# Patient Record
Sex: Female | Born: 1935 | Race: Black or African American | Hispanic: No | Smoking: Never smoker
Health system: Southern US, Community
[De-identification: ages and names within clinical notes are randomized; demographics above are authoritative.]

## PROBLEM LIST (undated history)

## (undated) DIAGNOSIS — I10 Essential (primary) hypertension: Secondary | ICD-10-CM

## (undated) DIAGNOSIS — F0390 Unspecified dementia without behavioral disturbance: Secondary | ICD-10-CM

## (undated) DIAGNOSIS — C50919 Malignant neoplasm of unspecified site of unspecified female breast: Secondary | ICD-10-CM

## (undated) DIAGNOSIS — F419 Anxiety disorder, unspecified: Secondary | ICD-10-CM

## (undated) DIAGNOSIS — F32A Depression, unspecified: Secondary | ICD-10-CM

## (undated) DIAGNOSIS — M1990 Unspecified osteoarthritis, unspecified site: Secondary | ICD-10-CM

## (undated) DIAGNOSIS — C801 Malignant (primary) neoplasm, unspecified: Secondary | ICD-10-CM

## (undated) HISTORY — PX: ABDOMINAL HYSTERECTOMY: SHX81

## (undated) HISTORY — DX: Unspecified dementia, unspecified severity, without behavioral disturbance, psychotic disturbance, mood disturbance, and anxiety: F03.90

## (undated) HISTORY — PX: MASTECTOMY: SHX3

## (undated) HISTORY — DX: Anxiety disorder, unspecified: F41.9

## (undated) HISTORY — DX: Depression, unspecified: F32.A

---

## 2004-06-10 ENCOUNTER — Ambulatory Visit: Payer: Self-pay | Admitting: Internal Medicine

## 2005-08-11 ENCOUNTER — Ambulatory Visit: Payer: Self-pay | Admitting: Internal Medicine

## 2006-08-14 ENCOUNTER — Ambulatory Visit: Payer: Self-pay | Admitting: Internal Medicine

## 2007-05-13 ENCOUNTER — Other Ambulatory Visit: Payer: Self-pay

## 2007-05-13 ENCOUNTER — Inpatient Hospital Stay: Payer: Self-pay | Admitting: Vascular Surgery

## 2007-08-16 ENCOUNTER — Ambulatory Visit: Payer: Self-pay | Admitting: Internal Medicine

## 2008-08-18 ENCOUNTER — Ambulatory Visit: Payer: Self-pay | Admitting: Internal Medicine

## 2009-07-25 ENCOUNTER — Ambulatory Visit: Payer: Self-pay | Admitting: Ophthalmology

## 2009-08-08 ENCOUNTER — Ambulatory Visit: Payer: Self-pay | Admitting: Ophthalmology

## 2009-08-28 ENCOUNTER — Ambulatory Visit: Payer: Self-pay | Admitting: Internal Medicine

## 2010-11-05 ENCOUNTER — Ambulatory Visit: Payer: Self-pay | Admitting: Internal Medicine

## 2011-11-20 ENCOUNTER — Ambulatory Visit: Payer: Self-pay | Admitting: Internal Medicine

## 2012-04-17 ENCOUNTER — Emergency Department: Payer: Self-pay | Admitting: Emergency Medicine

## 2012-04-17 LAB — COMPREHENSIVE METABOLIC PANEL
Anion Gap: 5 — ABNORMAL LOW (ref 7–16)
Calcium, Total: 9.5 mg/dL (ref 8.5–10.1)
Chloride: 106 mmol/L (ref 98–107)
Co2: 29 mmol/L (ref 21–32)
EGFR (African American): >60
EGFR (Non-African Amer.): 58 — ABNORMAL LOW
Potassium: 3.6 mmol/L (ref 3.5–5.1)
SGOT(AST): 21 U/L (ref 15–37)
SGPT (ALT): 22 U/L (ref 12–78)
Total Protein: 8.2 g/dL (ref 6.4–8.2)

## 2012-04-17 LAB — CBC
HCT: 37.8 % (ref 35.0–47.0)
MCV: 89 fL (ref 80–100)
Platelet: 336 10*3/uL (ref 150–440)
RBC: 4.26 10*6/uL (ref 3.80–5.20)
WBC: 8.2 10*3/uL (ref 3.6–11.0)

## 2012-04-17 LAB — CK TOTAL AND CKMB (NOT AT ARMC)
CK, Total: 154 U/L (ref 21–215)
CK-MB: 1.5 ng/mL (ref 0.5–3.6)

## 2012-07-12 ENCOUNTER — Ambulatory Visit: Payer: Self-pay | Admitting: Internal Medicine

## 2012-11-24 ENCOUNTER — Ambulatory Visit: Payer: Self-pay | Admitting: Ophthalmology

## 2013-02-08 ENCOUNTER — Ambulatory Visit: Payer: Self-pay | Admitting: Internal Medicine

## 2013-10-31 DIAGNOSIS — I10 Essential (primary) hypertension: Secondary | ICD-10-CM | POA: Insufficient documentation

## 2013-10-31 DIAGNOSIS — M1990 Unspecified osteoarthritis, unspecified site: Secondary | ICD-10-CM | POA: Insufficient documentation

## 2013-10-31 DIAGNOSIS — J302 Other seasonal allergic rhinitis: Secondary | ICD-10-CM | POA: Insufficient documentation

## 2013-10-31 DIAGNOSIS — E785 Hyperlipidemia, unspecified: Secondary | ICD-10-CM | POA: Insufficient documentation

## 2014-03-17 ENCOUNTER — Ambulatory Visit: Payer: Self-pay | Admitting: Internal Medicine

## 2014-05-19 DIAGNOSIS — M5416 Radiculopathy, lumbar region: Secondary | ICD-10-CM | POA: Insufficient documentation

## 2014-05-19 DIAGNOSIS — M5126 Other intervertebral disc displacement, lumbar region: Secondary | ICD-10-CM | POA: Insufficient documentation

## 2014-08-22 ENCOUNTER — Ambulatory Visit: Payer: Self-pay | Admitting: Gastroenterology

## 2014-09-25 LAB — SURGICAL PATHOLOGY

## 2015-03-13 DIAGNOSIS — M1711 Unilateral primary osteoarthritis, right knee: Secondary | ICD-10-CM | POA: Insufficient documentation

## 2015-11-21 DIAGNOSIS — E042 Nontoxic multinodular goiter: Secondary | ICD-10-CM | POA: Insufficient documentation

## 2016-12-04 ENCOUNTER — Other Ambulatory Visit: Payer: Self-pay

## 2016-12-04 ENCOUNTER — Emergency Department: Payer: Medicare Other

## 2016-12-04 ENCOUNTER — Emergency Department
Admission: EM | Admit: 2016-12-04 | Discharge: 2016-12-04 | Disposition: A | Discharge status: Home / Self Care | Payer: Medicare Other | Attending: Emergency Medicine | Admitting: Emergency Medicine

## 2016-12-04 ENCOUNTER — Encounter: Payer: Self-pay | Admitting: *Deleted

## 2016-12-04 DIAGNOSIS — R000 Tachycardia, unspecified: Secondary | ICD-10-CM | POA: Insufficient documentation

## 2016-12-04 DIAGNOSIS — R5381 Other malaise: Secondary | ICD-10-CM | POA: Diagnosis present

## 2016-12-04 DIAGNOSIS — R531 Weakness: Secondary | ICD-10-CM | POA: Diagnosis not present

## 2016-12-04 LAB — COMPREHENSIVE METABOLIC PANEL
ALK PHOS: 40 U/L (ref 38–126)
ALT: 14 U/L (ref 14–54)
ANION GAP: 11 (ref 5–15)
AST: 23 U/L (ref 15–41)
Albumin: 4.6 g/dL (ref 3.5–5.0)
BUN: 19 mg/dL (ref 6–20)
CALCIUM: 9.9 mg/dL (ref 8.9–10.3)
CO2: 26 mmol/L (ref 22–32)
Chloride: 103 mmol/L (ref 101–111)
Creatinine, Ser: 1.21 mg/dL — ABNORMAL HIGH (ref 0.44–1.00)
GFR calc non Af Amer: 41 mL/min — ABNORMAL LOW (ref >60)
GFR, EST AFRICAN AMERICAN: 48 mL/min — AB (ref >60)
Glucose, Bld: 92 mg/dL (ref 65–99)
Potassium: 4.1 mmol/L (ref 3.5–5.1)
SODIUM: 140 mmol/L (ref 135–145)
Total Bilirubin: 1.5 mg/dL — ABNORMAL HIGH (ref 0.3–1.2)
Total Protein: 7.7 g/dL (ref 6.5–8.1)

## 2016-12-04 LAB — URINALYSIS, COMPLETE (UACMP) WITH MICROSCOPIC
BILIRUBIN URINE: NEGATIVE
Bacteria, UA: NONE SEEN
GLUCOSE, UA: NEGATIVE mg/dL
HGB URINE DIPSTICK: NEGATIVE
KETONES UR: NEGATIVE mg/dL
Leukocytes, UA: NEGATIVE
NITRITE: NEGATIVE
PROTEIN: NEGATIVE mg/dL
Specific Gravity, Urine: 1.015 (ref 1.005–1.030)
WBC UA: NONE SEEN WBC/hpf (ref 0–5)
pH: 5 (ref 5.0–8.0)

## 2016-12-04 LAB — T4, FREE: Free T4: 0.8 ng/dL (ref 0.61–1.12)

## 2016-12-04 LAB — CBC
HCT: 37.6 % (ref 35.0–47.0)
HEMOGLOBIN: 12.8 g/dL (ref 12.0–16.0)
MCH: 30.6 pg (ref 26.0–34.0)
MCHC: 34.1 g/dL (ref 32.0–36.0)
MCV: 89.7 fL (ref 80.0–100.0)
Platelets: 283 10*3/uL (ref 150–440)
RBC: 4.19 MIL/uL (ref 3.80–5.20)
RDW: 13.8 % (ref 11.5–14.5)
WBC: 3.6 10*3/uL (ref 3.6–11.0)

## 2016-12-04 LAB — LIPASE, BLOOD: Lipase: 38 U/L (ref 11–51)

## 2016-12-04 LAB — TROPONIN I: Troponin I: <0.03 ng/mL (ref <0.03)

## 2016-12-04 LAB — TSH: TSH: 2.78 u[IU]/mL (ref 0.350–4.500)

## 2016-12-04 MED ORDER — SODIUM CHLORIDE 0.9 % IV SOLN
Freq: Once | INTRAVENOUS | Status: AC
  Administered 2016-12-04: 16:00:00 via INTRAVENOUS

## 2016-12-04 MED ORDER — LORAZEPAM 0.5 MG PO TABS: 0.5000 mg | ORAL_TABLET | Freq: Three times a day (TID) | ORAL | 0 refills | Status: AC | PRN

## 2016-12-04 NOTE — ED Triage Notes (Signed)
PT to ED reporting that she can not stop shaking and that she "donesn't feel well." Pt is shaking in triage but will stop when you hold her arms. PT is warm to the touch and denies feeling cold at this time. Pt denies pain and other than nausea denies symptoms other than general malaise. No slurred speech, weakness or droop. Pt is disoriented to time and forgets to answer multiple questions in triage. RN will ask a question and mid response pt stops answering and stares forward. When asked again the pt will answer the question. Oriented to self and situation. Pt denies SOB but is tachypnic. No wheezing noted at this time.

## 2016-12-04 NOTE — ED Provider Notes (Signed)
[facility] Emergency Department Provider Note       Time seen: ----------------------------------------- 3:52 PM on 12/04/2016 -----------------------------------------     I have reviewed the triage vital signs and the nursing notes.   HISTORY   Chief Complaint Altered Mental Status and Nausea    HPI Megan Ross is a 83 y.o. female who presents to the ED for intermittent shaking feeling in stating she does not feel well. Patient reports she feels cold all the time and has general malaise. She denies fevers, chills, chest pain, shortness of breath, vomiting or other complaints. Family is concerned she may not be drinking enough fluid and sometimes she gets anxious. She denies fevers, chills or other complaints at this time.   History reviewed. No pertinent past medical history.  There are no active problems to display for this patient.   History reviewed. No pertinent surgical history.  Allergies Patient has no allergy information on record.  Social History Social History  Substance Use Topics  . Smoking status: Never Smoker  . Smokeless tobacco: Never Used  . Alcohol use No    Review of Systems Constitutional: Negative for fever. Eyes: Negative for vision changes ENT:  Negative for congestion, sore throat Cardiovascular: Negative for chest pain. Respiratory: Negative for shortness of breath. Gastrointestinal: Negative for abdominal pain, vomiting and diarrhea. Genitourinary: Negative for dysuria. Musculoskeletal: Negative for back pain. Skin: Negative for rash. Neurological:Positive for weakness  All systems negative/normal/unremarkable except as stated in the HPI  ____________________________________________   PHYSICAL EXAM:  VITAL SIGNS: ED Triage Vitals  Enc Vitals Group     BP 12/04/16 1400 139/79     Pulse Rate 12/04/16 1400 (!) 110     Resp 12/04/16 1400 (!) 22     Temp 12/04/16 1400 99.3 F (37.4 C)     Temp  Source 12/04/16 1400 Oral     SpO2 12/04/16 1400 100 %     Weight 12/04/16 1401 180 lb (81.6 kg)     Height --      Head Circumference --      Peak Flow --      Pain Score --      Pain Loc --      Pain Edu? --      Excl. in [place]? --     Constitutional: AlertBut disoriented, Well appearing and in no distress. Eyes: Conjunctivae are normal. Normal extraocular movements. ENT   Head: Normocephalic and atraumatic.   Nose: No congestion/rhinnorhea.   Mouth/Throat: Mucous membranes are moist.   Neck: No stridor. Cardiovascular:Rapid rate, regular rhythm. No murmurs, rubs, or gallops. Respiratory: Normal respiratory effort without tachypnea nor retractions. Breath sounds are clear and equal bilaterally. No wheezes/rales/rhonchi. Gastrointestinal: Soft and nontender. Normal bowel sounds Musculoskeletal: Nontender with normal range of motion in extremities. No lower extremity tenderness nor edema. Neurologic:  Normal speech and language. No gross focal neurologic deficits are appreciated.  Skin:  Skin is warm, dry and intact. No rash noted. Psychiatric: Mood and affect are normal. Speech and behavior are normal.  ____________________________________________  EKG: Interpreted by me.Sinus tachycardia with a rate of 105 bpm, normal QRS size, normal QT, nonspecific T-wave abnormalities  ____________________________________________  ED COURSE:  Pertinent labs & imaging results that were available during my care of the patient were reviewed by me and considered in my medical decision making (see chart for details). Patient presents for weakness, we will assess with labs and imaging as indicated.   Procedures ____________________________________________  LABS (pertinent positives/negatives)  Labs Reviewed  COMPREHENSIVE METABOLIC PANEL - Abnormal; Notable for the following:       Result Value   Creatinine, Ser 1.21 (*)    Total Bilirubin 1.5 (*)    GFR calc non Af Amer 41 (*)     GFR calc Af Amer 48 (*)    All other components within normal limits  URINALYSIS, COMPLETE (UACMP) WITH MICROSCOPIC - Abnormal; Notable for the following:    Color, Urine YELLOW (*)    APPearance CLEAR (*)    Squamous Epithelial / LPF 0-5 (*)    All other components within normal limits  LIPASE, BLOOD  CBC  TSH  T4, FREE  TROPONIN I    RADIOLOGY Images were viewed by me  CT head, chest x-ray IMPRESSION: There is no pneumonia, CHF, nor other acute cardiopulmonary abnormality. 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  IMPRESSION: Stable an normal for age noncontrast CT appearance of the brain. ____________________________________________  FINAL ASSESSMENT AND PLAN  Weakness, Tachycardia  Plan: Patient's labs and imaging were dictated above. Patient had presented for general ill feeling and weakness. She may have some underlying anxiety but overall are only findings today are mild tachycardia. She does not have other objective signs for infection. I will discharge with close outpatient follow-up.   Earleen Newport, MD   Note: This note was generated in part or whole with voice recognition software. Voice recognition is usually quite accurate but there are transcription errors that can and very often do occur. I apologize for any typographical errors that were not detected and corrected.     Earleen Newport, MD 12/04/16 [phone]

## 2018-06-22 ENCOUNTER — Emergency Department: Payer: Medicare Other

## 2018-06-22 ENCOUNTER — Emergency Department
Admission: EM | Admit: 2018-06-22 | Discharge: 2018-06-22 | Disposition: A | Discharge status: Home / Self Care | Payer: Medicare Other | Attending: Emergency Medicine | Admitting: Emergency Medicine

## 2018-06-22 ENCOUNTER — Encounter: Payer: Self-pay | Admitting: Emergency Medicine

## 2018-06-22 ENCOUNTER — Other Ambulatory Visit: Payer: Self-pay

## 2018-06-22 DIAGNOSIS — Z853 Personal history of malignant neoplasm of breast: Secondary | ICD-10-CM | POA: Insufficient documentation

## 2018-06-22 DIAGNOSIS — R079 Chest pain, unspecified: Secondary | ICD-10-CM | POA: Insufficient documentation

## 2018-06-22 DIAGNOSIS — R0602 Shortness of breath: Secondary | ICD-10-CM | POA: Diagnosis not present

## 2018-06-22 DIAGNOSIS — I10 Essential (primary) hypertension: Secondary | ICD-10-CM | POA: Insufficient documentation

## 2018-06-22 HISTORY — DX: Malignant neoplasm of unspecified site of unspecified female breast: C50.919

## 2018-06-22 HISTORY — DX: Unspecified osteoarthritis, unspecified site: M19.90

## 2018-06-22 HISTORY — DX: Essential (primary) hypertension: I10

## 2018-06-22 HISTORY — DX: Malignant (primary) neoplasm, unspecified: C80.1

## 2018-06-22 LAB — CBC
HEMATOCRIT: 36.7 % (ref 36.0–46.0)
Hemoglobin: 11.7 g/dL — ABNORMAL LOW (ref 12.0–15.0)
MCH: 28.4 pg (ref 26.0–34.0)
MCHC: 31.9 g/dL (ref 30.0–36.0)
MCV: 89.1 fL (ref 80.0–100.0)
NRBC: 0 % (ref 0.0–0.2)
PLATELETS: 324 10*3/uL (ref 150–400)
RBC: 4.12 MIL/uL (ref 3.87–5.11)
RDW: 14.1 % (ref 11.5–15.5)
WBC: 9.2 10*3/uL (ref 4.0–10.5)

## 2018-06-22 LAB — TROPONIN I
Troponin I: <0.03 ng/mL (ref <0.03)
Troponin I: <0.03 ng/mL (ref <0.03)

## 2018-06-22 LAB — BASIC METABOLIC PANEL
Anion gap: 5 (ref 5–15)
BUN: 12 mg/dL (ref 8–23)
CHLORIDE: 107 mmol/L (ref 98–111)
CO2: 26 mmol/L (ref 22–32)
CREATININE: 0.85 mg/dL (ref 0.44–1.00)
Calcium: 9.4 mg/dL (ref 8.9–10.3)
Glucose, Bld: 152 mg/dL — ABNORMAL HIGH (ref 70–99)
POTASSIUM: 3.4 mmol/L — AB (ref 3.5–5.1)
SODIUM: 138 mmol/L (ref 135–145)

## 2018-06-22 MED ORDER — SODIUM CHLORIDE 0.9% FLUSH: 3.0000 mL | Freq: Once | INTRAVENOUS | Status: DC

## 2018-06-22 MED ORDER — ASPIRIN 81 MG PO CHEW
324.0000 mg | CHEWABLE_TABLET | Freq: Once | ORAL | Status: AC
  Administered 2018-06-22: 324 mg via ORAL
  Filled 2018-06-22: qty 4

## 2018-06-22 MED ORDER — IOHEXOL 350 MG/ML SOLN
75.0000 mL | Freq: Once | INTRAVENOUS | Status: AC | PRN
  Administered 2018-06-22: 75 mL via INTRAVENOUS

## 2018-06-22 NOTE — ED Notes (Signed)
Pt ambulatory to toilet at this time. Will obtain blood specimen when pt is finished using the bathroom.

## 2018-06-22 NOTE — Discharge Instructions (Addendum)
These make an appointment to see Dr. end, the cardiologist on-call, about your chest pain.  He may recommend additional testing, including possible stress test.  Return to the emergency department if you develop severe pain, lightheadedness or fainting, shortness of breath, fever, or any other symptoms concerning to you.

## 2018-06-22 NOTE — ED Notes (Signed)
Pt ambulatory to toilet

## 2018-06-22 NOTE — ED Provider Notes (Signed)
[facility] Emergency Department Provider Note  ____________________________________________  Time seen: Approximately 12:16 PM  I have reviewed the triage vital signs and the nursing notes.   HISTORY  Chief Complaint Chest Pain and Shortness of Breath    HPI Megan Ross is a 85 y.o. female w/ a hx of HTN, breast ca, presenting w/ CP and SOB.  Patient reports that she woke up at 4 AM with a severe central chest pressure associated with shortness of breath.  This lasted for several hours.  She denies any radiation, palpitations, lightheadedness or syncope, diaphoresis, nausea or vomiting.  She has not been experiencing chest pain with exertion over the last several weeks.  At this time, the patient is symptom-free.  She reports a stress test several years ago which was reportedly negative.  Past Medical History:  Diagnosis Date  . Arthritis   . Breast cancer ([place])   . Cancer ([place])   . Hypertension     There are no active problems to display for this patient.   Past Surgical History:  Procedure Laterality Date  . ABDOMINAL HYSTERECTOMY    . MASTECTOMY Right     Current Outpatient Rx  . Order #: [id number] Class: Historical Med  . Order #: [id number] Class: Historical Med  . Order #: [id number] Class: Historical Med  . Order #: [id number] Class: Historical Med  . Order #: [id number] Class: Historical Med  . Order #: [id number] Class: Historical Med  . Order #: [id number] Class: Historical Med  . Order #: [id number] Class: Historical Med  . Order #: [id number] Class: Historical Med    Allergies Patient has no known allergies.  No family history on file.  Social History Social History   Tobacco Use  . Smoking status: Never Smoker  . Smokeless tobacco: Never Used  Substance Use Topics  . Alcohol use: No  . Drug use: Not on file    Review of Systems Constitutional: No fever/chills.  No lightheadedness or syncope.  No diaphoresis. Eyes: No visual  changes. ENT: No sore throat. No congestion or rhinorrhea. Cardiovascular: Positive chest pain. Denies palpitations. Respiratory: Positive shortness of breath.  No cough. Gastrointestinal: No abdominal pain.  No nausea, no vomiting.  No diarrhea.  No constipation. Genitourinary: Negative for dysuria. Musculoskeletal: Negative for back pain.  No lower extremity swelling or calf pain.  Skin: Negative for rash. Neurological: Negative for headaches. No focal numbness, tingling or weakness.     ____________________________________________   PHYSICAL EXAM:  VITAL SIGNS: ED Triage Vitals  Enc Vitals Group     BP 06/22/18 0946 (!) 155/77     Pulse Rate 06/22/18 0946 74     Resp 06/22/18 0946 16     Temp 06/22/18 0946 98.1 F (36.7 C)     Temp Source 06/22/18 0946 Oral     SpO2 06/22/18 0946 98 %     Weight 06/22/18 0947 160 lb (72.6 kg)     Height 06/22/18 0947 5\' 5"  (1.651 m)     Head Circumference --      Peak Flow --      Pain Score 06/22/18 0946 3     Pain Loc --      Pain Edu? --      Excl. in [place]? --     Constitutional: Alert and oriented. Answers questions appropriately. Eyes: Conjunctivae are normal.  EOMI. No scleral icterus. Head: Atraumatic. Nose: No congestion/rhinnorhea. Mouth/Throat: Mucous membranes are moist.  Neck: No stridor.  Supple.  No JVD.  No meningismus. Cardiovascular: Normal  rate, regular rhythm.  Holosystolic murmur without rubs or gallops. Occasional added beat Respiratory: Normal respiratory effort.  No accessory muscle use or retractions. Lungs CTAB.  No wheezes, rales or ronchi. Gastrointestinal: Soft, nontender and nondistended.  No guarding or rebound.  No peritoneal signs. Musculoskeletal: No LE edema. No ttp in the calves or palpable cords.  Negative Homan's sign. Neurologic:  A&Ox3.  Speech is clear.  Face and smile are symmetric.  EOMI.  Moves all extremities well. Skin:  Skin is warm, dry and intact. No rash noted. Psychiatric: Mood and  affect are normal.   ____________________________________________   LABS (all labs ordered are listed, but only abnormal results are displayed)  Labs Reviewed  BASIC METABOLIC PANEL - Abnormal; Notable for the following components:      Result Value   Potassium 3.4 (*)    Glucose, Bld 152 (*)    All other components within normal limits  CBC - Abnormal; Notable for the following components:   Hemoglobin 11.7 (*)    All other components within normal limits  TROPONIN I  TROPONIN I   ____________________________________________  EKG  ED ECG REPORT I, Anne-Caroline Mariea Clonts, the attending physician, personally viewed and interpreted this ECG.   Date: 06/22/2018  EKG Time: 942  Rate: 76  Rhythm: normal sinus rhythm  Axis: normal  Intervals:none  ST&T Change: No STEMI  ____________________________________________  RADIOLOGY  Dg Chest 2 View  Result Date: 06/22/2018 CLINICAL DATA:  Chest tightness and shortness of breath EXAM: CHEST - 2 VIEW COMPARISON:  December 04, 2016 FINDINGS: The heart size and mediastinal contours are within normal limits. Mild patchy opacity of the left lung base is identified, favor atelectasis but developing pneumonia is not excluded. There is no pulmonary edema or pleural effusion. The visualized skeletal structures are stable. IMPRESSION: Mild patchy opacities identified in the left lung base, favor atelectasis but developing pneumonia is not excluded. Electronically Signed   By: Abelardo Diesel M.D.   On: 06/22/2018 10:31   Ct Angio Chest Pe W And/or Wo Contrast  Result Date: 06/22/2018 CLINICAL DATA:  85 year old female with acute chest pain and shortness of breath for 1 day. EXAM: CT ANGIOGRAPHY CHEST WITH CONTRAST TECHNIQUE: Multidetector CT imaging of the chest was performed using the standard protocol during bolus administration of intravenous contrast. Multiplanar CT image reconstructions and MIPs were obtained to evaluate the vascular anatomy.  CONTRAST:  25mL OMNIPAQUE IOHEXOL 350 MG/ML SOLN COMPARISON:  06/22/2018 chest radiograph FINDINGS: Cardiovascular: This is a technically satisfactory study. No pulmonary emboli are identified. Cardiomegaly is present. Aortic atherosclerotic calcifications noted without aneurysm. There is no evidence of pericardial effusion. Mediastinum/Nodes: No enlarged mediastinal, hilar, or axillary lymph nodes. Thyroid gland, trachea, and esophagus demonstrate no significant findings. Lungs/Pleura: There is no evidence of airspace disease, consolidation, mass, nodule, pleural effusion or pneumothorax. Mild bilateral dependent opacities probably represent atelectasis. Upper Abdomen: No acute abnormality. Musculoskeletal: No acute or suspicious bony abnormalities. Review of the MIP images confirms the above findings. IMPRESSION: 1. No evidence of pulmonary emboli. 2. Cardiomegaly 3. Mild bilateral dependent pulmonary opacities probably representing mild atelectasis. 4.  Aortic Atherosclerosis (ICD10-I70.0). Electronically Signed   By: Margarette Canada M.D.   On: 06/22/2018 12:40    ____________________________________________   PROCEDURES  Procedure(s) performed: None  Procedures  Critical Care performed: No ____________________________________________   INITIAL IMPRESSION / ASSESSMENT AND PLAN / ED COURSE  Pertinent labs & imaging results that were available during my care of the patient were reviewed by  me and considered in my medical decision making (see chart for details).  85 y.o. female w/ a hx of HTN presenting w/ CP and SOB.  Overall, the patient is mildly hypertensive with a blood pressure 155/77 but otherwise hemodynamically stable.  Her EKG does not show any ischemia or arrhythmia.  Her first troponin is negative and a second troponin is pending at this time.  Her chest x-ray shows some atelectasis and the patient has not clinically been having any pneumonia symptoms of infection is very unlikely.   Given her history of breast cancer, will get a CT to rule out PE.  Aortic pathology is very unlikely;r GI causes are also possible. if the patient has a reassuring CT, and a negative second troponin, we will plan to have her follow-up with cardiology as an outpatient.  ----------------------------------------- 1:02 PM on 06/22/2018 -----------------------------------------  Patient's work-up in the emergency department has been reassuring.  Both of her troponins are negative and she continues to be chest pain-free.  She is hemodynamically stable.  Her chest CT chest does not show any evidence of PE or other gross abnormality that would have caused her symptoms this morning.  I will plan to discharge her home and have her follow-up with a cardiologist for wrist edification study.  Return precautions as well as follow-up instructions were discussed. ____________________________________________  FINAL CLINICAL IMPRESSION(S) / ED DIAGNOSES  Final diagnoses:  Chest pain, unspecified type  Shortness of breath         NEW MEDICATIONS STARTED DURING THIS VISIT:  New Prescriptions   No medications on file      Eula Listen, MD 06/22/18 1302

## 2018-06-22 NOTE — ED Triage Notes (Signed)
During the night she had chest tightness and shortness of breath.  Says she still has some slight chest tightness

## 2018-06-22 NOTE — ED Notes (Signed)
Gave pt urine cup with bag for specimen collection.

## 2018-07-05 NOTE — Progress Notes (Signed)
Cardiology Office Note  Date:  07/09/2018   ID:  Megan Ross, DOB 1934/01/15, MRN [id number]  PCP:  Tracie Harrier, MD   Chief Complaint  Patient presents with  . other    Follow up from [facility] ER; chest pain. "doing well." Meds reviewed by the pt. verbally.     HPI:  Megan Ross is a 85 y.o. female w/ a hx of  HTN,  anemia breast ca,  Referred by Dr. Mariea Clonts after being seen in the ER for chest pain and SOB  She reports that on June 22, 2018, She woke up at 4 AM with a severe central chest pressure associated with shortness of breath.  Felt like a knot in her chest.  Symptoms did not radiate  lasted for several hours.   Went to the emergency room for further evaluation  Denied any palpitations, lightheadedness or syncope, diaphoresis, nausea or vomiting.    She reported no similar symptoms over the several weeks prior  No symptoms since her visit to the emergency room In the emergency room was symptom-free   Lab work reviewed  Hgb;12.1, Sugar ;109,A1c: 6.5, Se creat : 1.2 Potassium 3.4  Other records reviewed  stress test several years ago which was reportedly negative.  CT chest 06/22/2018, images pulled up in the office and discussed with her Aortic athero, mild in the descending thoracic aorta  EKG personally reviewed by myself on todays visit Shows normal sinus rhythm with rate 67 bpm no significant ST or T wave changes   PMH:   has a past medical history of Arthritis, Breast cancer ([place]), Cancer ([place]), and Hypertension.  PSH:    Past Surgical History:  Procedure Laterality Date  . ABDOMINAL HYSTERECTOMY    . MASTECTOMY Right     Current Outpatient Medications  Medication Sig Dispense Refill  . dorzolamide (TRUSOPT) 2 % ophthalmic solution Place 1 drop into both eyes 2 (two) times daily.   4  . fluticasone (FLONASE) 50 MCG/ACT nasal spray Place 2 sprays into both nostrils daily.   4  . hydrochlorothiazide (MICROZIDE) 12.5 MG capsule Take 12.5 mg  by mouth daily.  4  . latanoprost (XALATAN) 0.005 % ophthalmic solution Place 1 drop into both eyes at bedtime.  4  . lisinopril (PRINIVIL,ZESTRIL) 20 MG tablet Take 20 mg by mouth daily.  5  . montelukast (SINGULAIR) 10 MG tablet Take 10 mg by mouth at bedtime.  4  . ranitidine (ZANTAC) 150 MG tablet Take 150 mg by mouth 2 (two) times daily.  4  . simvastatin (ZOCOR) 20 MG tablet Take 20 mg by mouth daily.  0  . timolol (TIMOPTIC) 0.5 % ophthalmic solution Place 1 drop into both eyes 2 (two) times daily.   4  . potassium chloride (K-DUR) 10 MEQ tablet Take 1 tablet (10 mEq total) by mouth daily. 90 tablet 1   No current facility-administered medications for this visit.      Allergies:   Patient has no known allergies.   Social History:  The patient  reports that she has never smoked. She has never used smokeless tobacco. She reports that she does not drink alcohol.   Family History:   family history is not on file.    Review of Systems: Review of Systems  Constitutional: Negative.   Respiratory: Negative.   Cardiovascular: Positive for chest pain.  Gastrointestinal: Negative.   Musculoskeletal: Negative.   Neurological: Negative.   Psychiatric/Behavioral: Negative.   All other systems reviewed and  are negative.   PHYSICAL EXAM: VS:  BP (!) 160/78 (BP Location: Left Arm, Patient Position: Sitting, Cuff Size: Normal)   Pulse 67   Ht 5\' 5"  (1.651 m)   Wt 171 lb (77.6 kg)   BMI 28.46 kg/m  , BMI Body mass index is 28.46 kg/m. GEN: Well nourished, well developed, in no acute distress  HEENT: normal  Neck: no JVD, carotid bruits, or masses Cardiac: RRR; no murmurs, rubs, or gallops,no edema  Respiratory:  clear to auscultation bilaterally, normal work of breathing GI: soft, nontender, nondistended, + BS MS: no deformity or atrophy  Skin: warm and dry, no rash Neuro:  Strength and sensation are intact Psych: euthymic mood, full affect  Recent Labs: 06/22/2018: BUN 12;  Creatinine, Ser 0.85; Hemoglobin 11.7; Platelets 324; Potassium 3.4; Sodium 138    Lipid Panel     Wt Readings from Last 3 Encounters:  07/09/18 171 lb (77.6 kg)  06/22/18 160 lb (72.6 kg)  12/04/16 180 lb (81.6 kg)       ASSESSMENT AND PLAN:  Aortic atherosclerosis (HCC) Seen on CT scan, mild Will defer to primary care whether they would like to treat more aggressively Could consider adding Zetia 10 mg daily or increasing her simvastatin to achieve goal LDL less than 70  Chest pain with moderate risk for cardiac etiology - Plan: EKG 12-Lead Atypical in nature, CT scan with no significant coronary calcifications Good exercise tolerance with no recurrent symptoms Recommended if she continues to have no further symptoms, no further work-up needed at this time If she has recurrent symptoms particularly with exertion, stress test could be ordered  SOB (shortness of breath) - Plan: EKG 12-Lead Denies having shortness of breath with house chores No clinical signs of heart failure No further work-up  Benign essential HTN Blood pressure elevated today but is well controlled on previous visits with primary care.  No changes to her medications,  She will monitor blood pressure at home and call our office if this runs high  Anemia, unspecified type Managed by primary care  Disposition:   F/U as needed   Total encounter time more than 60 minutes  Greater than 50% was spent in counseling and coordination of care with the patient    Orders Placed This Encounter  Procedures  . EKG 12-Lead     Signed, Esmond Plants, M.D., Ph.D. 07/09/2018  [place], [place]

## 2018-07-08 DIAGNOSIS — R0602 Shortness of breath: Secondary | ICD-10-CM | POA: Insufficient documentation

## 2018-07-08 DIAGNOSIS — I10 Essential (primary) hypertension: Secondary | ICD-10-CM | POA: Insufficient documentation

## 2018-07-08 DIAGNOSIS — R079 Chest pain, unspecified: Secondary | ICD-10-CM | POA: Insufficient documentation

## 2018-07-08 DIAGNOSIS — D649 Anemia, unspecified: Secondary | ICD-10-CM | POA: Insufficient documentation

## 2018-07-09 ENCOUNTER — Encounter: Payer: Self-pay | Admitting: Cardiovascular Disease

## 2018-07-09 ENCOUNTER — Ambulatory Visit: Payer: Medicare Other | Admitting: Cardiovascular Disease

## 2018-07-09 VITALS — BP 160/78 | HR 67 | Ht 65.0 in | Wt 171.0 lb

## 2018-07-09 DIAGNOSIS — R079 Chest pain, unspecified: Secondary | ICD-10-CM | POA: Diagnosis not present

## 2018-07-09 DIAGNOSIS — R0602 Shortness of breath: Secondary | ICD-10-CM | POA: Diagnosis not present

## 2018-07-09 DIAGNOSIS — I10 Essential (primary) hypertension: Secondary | ICD-10-CM | POA: Diagnosis not present

## 2018-07-09 DIAGNOSIS — D649 Anemia, unspecified: Secondary | ICD-10-CM

## 2018-07-09 DIAGNOSIS — I700 Atherosclerosis of aorta: Secondary | ICD-10-CM | POA: Insufficient documentation

## 2018-07-09 MED ORDER — POTASSIUM CHLORIDE ER 10 MEQ PO TBCR: 10.0000 meq | EXTENDED_RELEASE_TABLET | Freq: Every day | ORAL | 1 refills | Status: DC

## 2018-07-09 NOTE — Patient Instructions (Addendum)
Medication Instructions:  Please add potassium pill daily   If you need a refill on your cardiac medications before your next appointment, please call your pharmacy.    Lab work: No new labs needed   If you have labs (blood work) drawn today and your tests are completely normal, you will receive your results only by: Marland Kitchen MyChart Message (if you have MyChart) OR . A paper copy in the mail If you have any lab test that is abnormal or we need to change your treatment, we will call you to review the results.   Testing/Procedures: No new testing needed   Follow-Up: At [facility], you and your health needs are our priority.  As part of our continuing mission to provide you with exceptional heart care, we have created designated Provider Care Teams.  These Care Teams include your primary Cardiologist (physician) and Advanced Practice Providers (APPs -  Physician Assistants and Nurse Practitioners) who all work together to provide you with the care you need, when you need it.  . You will need a follow up appointment as needed  . Providers on your designated Care Team:   . [place] Hodgkins, NP . Christell Faith, PA-C . Marrianne Mood, PA-C  Any Other Special Instructions Will Be Listed Below (If Applicable).  For educational health videos Log in to : [email or web address] Or : [email or web address], password : [email or web address]

## 2018-08-06 ENCOUNTER — Other Ambulatory Visit: Payer: Self-pay | Admitting: Internal Medicine

## 2018-08-06 DIAGNOSIS — Z9011 Acquired absence of right breast and nipple: Secondary | ICD-10-CM | POA: Insufficient documentation

## 2018-08-06 DIAGNOSIS — Z1231 Encounter for screening mammogram for malignant neoplasm of breast: Secondary | ICD-10-CM

## 2018-08-27 ENCOUNTER — Ambulatory Visit: Payer: Medicare Other | Admitting: Internal Medicine

## 2018-12-20 ENCOUNTER — Other Ambulatory Visit: Payer: Self-pay | Admitting: Cardiovascular Disease

## 2019-03-12 ENCOUNTER — Other Ambulatory Visit: Payer: Self-pay | Admitting: Cardiovascular Disease

## 2019-04-05 ENCOUNTER — Ambulatory Visit
Admission: RE | Admit: 2019-04-05 | Discharge: 2019-04-05 | Disposition: A | Discharge status: Home / Self Care | Payer: Medicare Other | Source: Ambulatory Visit | Attending: Internal Medicine | Admitting: Internal Medicine

## 2019-04-05 DIAGNOSIS — Z1231 Encounter for screening mammogram for malignant neoplasm of breast: Secondary | ICD-10-CM | POA: Diagnosis not present

## 2019-06-06 ENCOUNTER — Other Ambulatory Visit: Payer: Self-pay | Admitting: Cardiovascular Disease

## 2019-07-23 ENCOUNTER — Ambulatory Visit: Payer: Medicare Other

## 2019-08-08 ENCOUNTER — Other Ambulatory Visit: Payer: Self-pay | Admitting: Cardiovascular Disease

## 2019-08-08 NOTE — Telephone Encounter (Signed)
Attempted to schedule no ans no vm  

## 2019-08-08 NOTE — Telephone Encounter (Signed)
Patient needs an appointment for further refills. If patient does not want to schedule an appointment please make them aware to contact PCP for refills. I have sent in enough medication until appointment can be made.   Thanks Ladies!

## 2019-08-10 ENCOUNTER — Other Ambulatory Visit: Payer: Self-pay

## 2019-08-10 ENCOUNTER — Encounter: Payer: Self-pay | Admitting: Family

## 2019-08-10 ENCOUNTER — Ambulatory Visit (INDEPENDENT_AMBULATORY_CARE_PROVIDER_SITE_OTHER): Payer: Medicare Other | Admitting: Family

## 2019-08-10 VITALS — BP 120/70 | HR 73 | Ht 65.0 in | Wt 178.5 lb

## 2019-08-10 DIAGNOSIS — R0602 Shortness of breath: Secondary | ICD-10-CM

## 2019-08-10 DIAGNOSIS — I10 Essential (primary) hypertension: Secondary | ICD-10-CM

## 2019-08-10 DIAGNOSIS — I700 Atherosclerosis of aorta: Secondary | ICD-10-CM

## 2019-08-10 NOTE — Patient Instructions (Signed)
Medication Instructions:  NO medication changes today.  *If you need a refill on your cardiac medications before your next appointment, please call your pharmacy*   Lab Work: No blood work today. Your labs from primary care looked overall good. Your LDL or "bad cholesterol" was 138 and we would like it to be closer to 100. We will start with dietary changes.   If you have labs (blood work) drawn today and your tests are completely normal, you will receive your results only by: Marland Kitchen MyChart Message (if you have MyChart) OR . A paper copy in the mail If you have any lab test that is abnormal or we need to change your treatment, we will call you to review the results.  Testing/Procedures: Your EKG today showed sinus rhythm with an occasional PVC which is an early beat in the bottom chambers of your heart. This is not dangerous but sometimes feels like a skipped beat.  Follow-Up: At [facility], you and your health needs are our priority.  As part of our continuing mission to provide you with exceptional heart care, we have created designated Provider Care Teams.  These Care Teams include your primary Cardiologist (physician) and Advanced Practice Providers (APPs -  Physician Assistants and Nurse Practitioners) who all work together to provide you with the care you need, when you need it.  We recommend signing up for the patient portal called "MyChart".  Sign up information is provided on this After Visit Summary.  MyChart is used to connect with patients for Virtual Visits (Telemedicine).  Patients are able to view lab/test results, encounter notes, upcoming appointments, etc.  Non-urgent messages can be sent to your provider as well.   To learn more about what you can do with MyChart, go to [email or web address].    Your next appointment:   1 year(s)  The format for your next appointment:   In Person  Provider:    You may see Ida Rogue, MD or one of the following Advanced Practice  Providers on your designated Care Team:    Murray Hodgkins, NP  Christell Faith, PA-C  Marrianne Mood, PA-C  Other Instructions  Fat and Cholesterol Restricted Eating Plan Getting too much fat and cholesterol in your diet may cause health problems. Choosing the right foods helps keep your fat and cholesterol at normal levels. This can keep you from getting certain diseases.   What are tips for following this plan? Meal planning  At meals, divide your plate into four equal parts: ? Fill one-half of your plate with vegetables and green salads. ? Fill one-fourth of your plate with whole grains. ? Fill one-fourth of your plate with low-fat (lean) protein foods.  Eat fish that is high in omega-3 fats at least two times a week. This includes mackerel, tuna, sardines, and salmon.  Eat foods that are high in fiber, such as whole grains, beans, apples, broccoli, carrots, peas, and barley. General tips   Work with your doctor to lose weight if you need to.  Avoid: ? Foods with added sugar. ? Fried foods. ? Foods with partially hydrogenated oils.  Limit alcohol intake to no more than 1 drink a day for nonpregnant women and 2 drinks a day for men. One drink equals 12 oz of beer, 5 oz of wine, or 1 oz of hard liquor. Reading food labels  Check food labels for: ? Trans fats. ? Partially hydrogenated oils. ? Saturated fat (g) in each serving. ? Cholesterol (mg) in each serving. ?  Fiber (g) in each serving.  Choose foods with healthy fats, such as: ? Monounsaturated fats. ? Polyunsaturated fats. ? Omega-3 fats.  Choose grain products that have whole grains. Look for the word "whole" as the first word in the ingredient list. Cooking  Cook foods using low-fat methods. These include baking, boiling, grilling, and broiling.  Eat more home-cooked foods. Eat at restaurants and buffets less often.  Avoid cooking using saturated fats, such as butter, cream, palm oil, palm kernel oil,  and coconut oil. Recommended foods  Fruits  All fresh, canned (in natural juice), or frozen fruits. Vegetables  Fresh or frozen vegetables (raw, steamed, roasted, or grilled). Green salads. Grains  Whole grains, such as whole wheat or whole grain breads, crackers, cereals, and pasta. Unsweetened oatmeal, bulgur, barley, quinoa, or brown rice. Corn or whole wheat flour tortillas. Meats and other protein foods  Ground beef (85% or leaner), grass-fed beef, or beef trimmed of fat. Skinless chicken or [place]. Ground chicken or [place]. Pork trimmed of fat. All fish and seafood. Egg whites. Dried beans, peas, or lentils. Unsalted nuts or seeds. Unsalted canned beans. Nut butters without added sugar or oil. Dairy  Low-fat or nonfat dairy products, such as skim or 1% milk, 2% or reduced-fat cheeses, low-fat and fat-free ricotta or cottage cheese, or plain low-fat and nonfat yogurt. Fats and oils  Tub margarine without trans fats. Light or reduced-fat mayonnaise and salad dressings. Avocado. Olive, canola, sesame, or safflower oils. The items listed above may not be a complete list of foods and beverages you can eat. Contact a dietitian for more information. Foods to avoid Fruits  Canned fruit in heavy syrup. Fruit in cream or butter sauce. Fried fruit. Vegetables  Vegetables cooked in cheese, cream, or butter sauce. Fried vegetables. Grains  White bread. White pasta. White rice. Cornbread. Bagels, pastries, and croissants. Crackers and snack foods that contain trans fat and hydrogenated oils. Meats and other protein foods  Fatty cuts of meat. Ribs, chicken wings, bacon, sausage, bologna, salami, chitterlings, fatback, hot dogs, bratwurst, and packaged lunch meats. Liver and organ meats. Whole eggs and egg yolks. Chicken and [place] with skin. Fried meat. Dairy  Whole or 2% milk, cream, half-and-half, and cream cheese. Whole milk cheeses. Whole-fat or sweetened yogurt. Full-fat cheeses.  Nondairy creamers and whipped toppings. Processed cheese, cheese spreads, and cheese curds. Beverages  Alcohol. Sugar-sweetened drinks such as sodas, lemonade, and fruit drinks. Fats and oils  Butter, stick margarine, lard, shortening, ghee, or bacon fat. Coconut, palm kernel, and palm oils. Sweets and desserts  Corn syrup, sugars, honey, and molasses. Candy. Jam and jelly. Syrup. Sweetened cereals. Cookies, pies, cakes, donuts, muffins, and ice cream. The items listed above may not be a complete list of foods and beverages you should avoid. Contact a dietitian for more information. Summary  Choosing the right foods helps keep your fat and cholesterol at normal levels. This can keep you from getting certain diseases.  At meals, fill one-half of your plate with vegetables and green salads.  Eat high-fiber foods, like whole grains, beans, apples, carrots, peas, and barley.  Limit added sugar, saturated fats, alcohol, and fried foods. This information is not intended to replace advice given to you by your health care provider. Make sure you discuss any questions you have with your health care provider. Document Revised: 01/20/2018 Document Reviewed: 02/03/2017 Elsevier Patient Education  [place].

## 2019-08-10 NOTE — Progress Notes (Signed)
Office Visit    Patient Name: Megan Ross Date of Encounter: 08/10/2019  Primary Care Provider:  Tracie Harrier, MD Primary Cardiologist:  Ida Rogue, MD Electrophysiologist:  None   Chief Complaint    Megan Ross is a 86 y.o. female with a hx of HTN, anemia, breast cancer, chest pain presents today for follow up of chest pain.   Past Medical History    Past Medical History:  Diagnosis Date  . Arthritis   . Breast cancer ([place])    right breast ca  . Cancer ([place])   . Hypertension    Past Surgical History:  Procedure Laterality Date  . ABDOMINAL HYSTERECTOMY    . MASTECTOMY Right    breast ca    Allergies  No Known Allergies  History of Present Illness    Megan Ross is a 86 y.o. female with a hx of f HTN, anemia, breast cancer s/p R mastectomy, diverticulosis, multinodular goiter, chest pain, GERD, osteoarthritis last seen by Dr. Arta Bruce 07/2018.  January 2020 presented to the ED for chest pain associated with shortness of breath.  CT chest per Dr. Theda Sers review showed aortic atherosclerosis with mild atherosclerosis in descending thoracic aorta.  EKG with no acute changes.  No further evaluation recommended that time.  Her Lisinopril was previously switched to Losartan for dizziness. At visit with her PCP January 2021 her Losartan was increased for elevated blood pressures.   Labs via Care Everywhere 06/02/19  Total cholesterol 218, triglycerides 124, HDL 55.4, LDL 138  A1c 6.3  Hemoglobin 12.7  K4.2, creatinine 0.8, GFR 83, AST 13, ALT 10  Present today with her niece.  Reports no chest pain, pressure, tightness.  Reports no shortness of breath at rest nor dyspnea on exertion.  We discussed the PVC noted on EKG.  She reports no palpitations.  We reviewed her cholesterol numbers from December with her primary care with LDL 138.  We discussed LDL goal of less than 100.  We discussed the potential addition of Zetia to her statin.  She prefers  to proceed with dietary changes.  A lipid-lowering diet was discussed.  EKGs/Labs/Other Studies Reviewed:   The following studies were reviewed today:  EKG:  EKG is ordered today.  The ekg ordered today demonstrates SR 73 bpm with 3 PVCs.  Recent Labs: No results found for requested labs within last 8760 hours.  Recent Lipid Panel No results found for: CHOL, TRIG, HDL, CHOLHDL, VLDL, LDLCALC, LDLDIRECT  Home Medications   Current Meds  Medication Sig  . dorzolamide (TRUSOPT) 2 % ophthalmic solution Place 1 drop into both eyes 2 (two) times daily.   . fluticasone (FLONASE) 50 MCG/ACT nasal spray Place 2 sprays into both nostrils daily.   . hydrochlorothiazide (MICROZIDE) 12.5 MG capsule Take 12.5 mg by mouth daily.  Marland Kitchen latanoprost (XALATAN) 0.005 % ophthalmic solution Place 1 drop into both eyes at bedtime.  Marland Kitchen lisinopril (PRINIVIL,ZESTRIL) 20 MG tablet Take 20 mg by mouth daily.  . montelukast (SINGULAIR) 10 MG tablet Take 10 mg by mouth at bedtime.  . potassium chloride (KLOR-CON) 10 MEQ tablet TAKE 1 TABLET BY MOUTH EVERY DAY  . ranitidine (ZANTAC) 150 MG tablet Take 150 mg by mouth 2 (two) times daily.  . simvastatin (ZOCOR) 20 MG tablet Take 20 mg by mouth daily.  . timolol (TIMOPTIC) 0.5 % ophthalmic solution Place 1 drop into both eyes 2 (two) times daily.       Review of Systems  Review of Systems  Constitution: Negative for chills, fever and malaise/fatigue.  Cardiovascular: Negative for chest pain, dyspnea on exertion, leg swelling, near-syncope, orthopnea, palpitations and syncope.  Respiratory: Negative for cough, shortness of breath and wheezing.   Gastrointestinal: Negative for nausea and vomiting.  Neurological: Negative for dizziness, light-headedness and weakness.   All other systems reviewed and are otherwise negative except as noted above.  Physical Exam    VS:  BP 120/70 (BP Location: Left Arm, Patient Position: Sitting, Cuff Size: Normal)   Pulse 73    Ht 5\' 5"  (1.651 m)   Wt 178 lb 8 oz (81 kg)   SpO2 98%   BMI 29.70 kg/m  , BMI Body mass index is 29.7 kg/m. GEN: Well nourished, well developed, in no acute distress. HEENT: normal. Neck: Supple, no JVD, carotid bruits, or masses. Cardiac: RRR, no murmurs, rubs, or gallops. No clubbing, cyanosis, edema.  Radials/DP/PT 2+ and equal bilaterally.  Respiratory:  Respirations regular and unlabored, clear to auscultation bilaterally. GI: Soft, nontender, nondistended, BS + x 4. MS: No deformity or atrophy. Skin: Warm and dry, no rash. Neuro:  Strength and sensation are intact. Psych: Normal affect.  Assessment & Plan    1. Chest pain -no recurrent chest pain.  No indication for ischemic evaluation at this time.  Continue primary prevention including a low-sodium heart healthy diet and regular cardiovascular exercise. 2. HTN -blood pressure well controlled.  Continue present antihypertensive regimen including HCTZ 12.5 mg daily, losartan 50 mg daily.  Her blood pressure medications were recently adjusted by her primary care in January and the changes have brought her blood pressure to goal of less than 130/80. 3. HLD -LDL goal less than 100.  Recent LDL December 2020 with her primary care 138.  Discussed the potential addition of Zetia to her simvastatin 20 mg.  She politely declines and prefers to proceed with dietary changes at the time.  Recommend lowering diet with reassessment of lipids in 3 to 6 months.. 4. GERD -continue omeprazole as prescribed by her primary care office.  Disposition: Follow up in 1 year(s) with Dr. Rockey Situ or APP   Loel Dubonnet, NP 08/10/2019, 3:20 PM

## 2020-01-04 ENCOUNTER — Other Ambulatory Visit: Payer: Self-pay | Admitting: Cardiovascular Disease

## 2020-01-04 NOTE — Telephone Encounter (Signed)
Please advise for potassium alternatives when prompted for refill.

## 2020-03-28 ENCOUNTER — Other Ambulatory Visit: Payer: Self-pay | Admitting: Cardiovascular Disease

## 2020-06-22 ENCOUNTER — Other Ambulatory Visit: Payer: Self-pay | Admitting: Cardiovascular Disease

## 2020-09-18 ENCOUNTER — Other Ambulatory Visit: Payer: Self-pay | Admitting: Internal Medicine

## 2020-09-18 DIAGNOSIS — Z1231 Encounter for screening mammogram for malignant neoplasm of breast: Secondary | ICD-10-CM

## 2020-11-12 ENCOUNTER — Other Ambulatory Visit: Payer: Self-pay | Admitting: Cardiovascular Disease

## 2020-11-20 IMAGING — CR DG CHEST 2V
2 series · 2 of 2 positions shown · non-contrast
Comparison: December 04, 2016

CLINICAL DATA: Chest tightness and shortness of breath

EXAM:
CHEST - 2 VIEW

[chest pa]
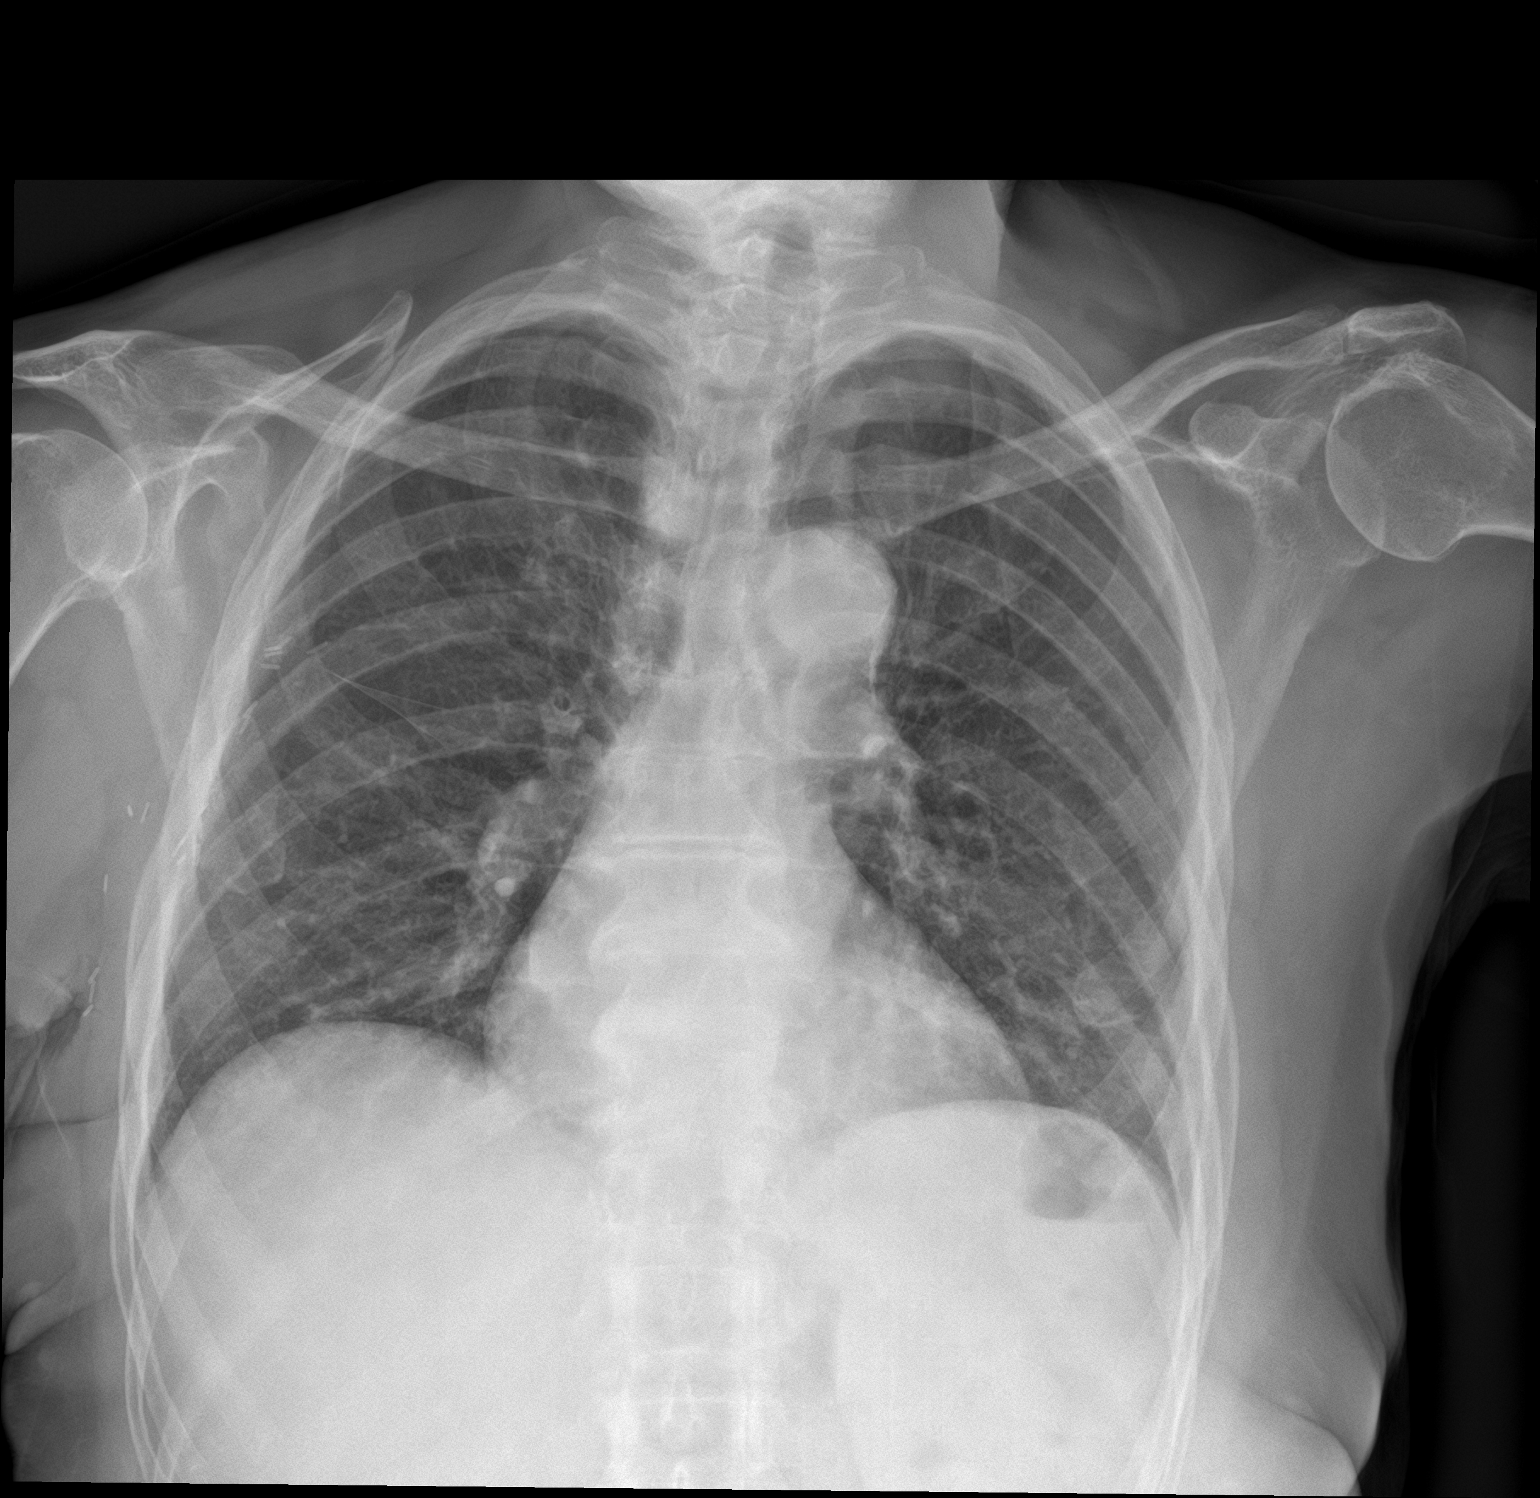

[chest lat]
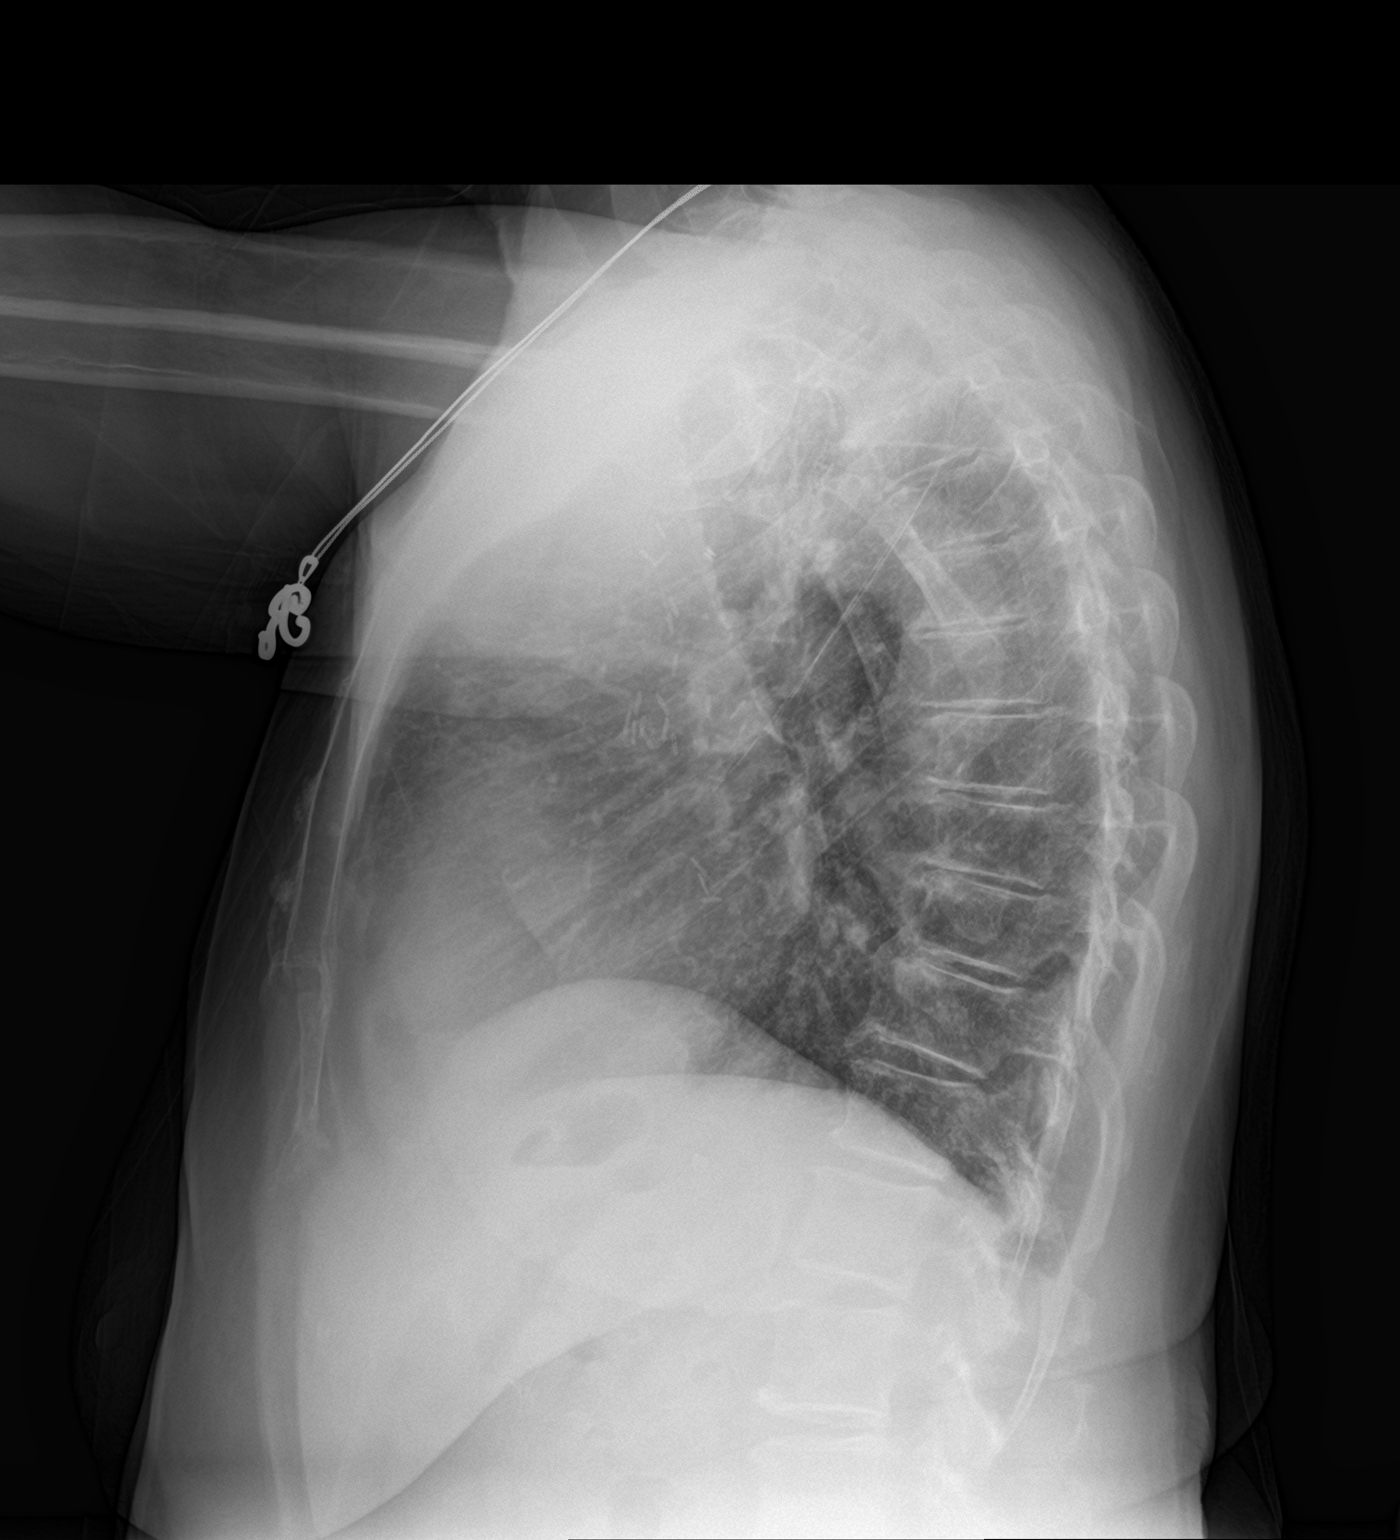

[2 of 2 positions shown; findings below may reference images not displayed]

FINDINGS: The heart size and mediastinal contours are within normal limits.
Mild patchy opacity of the left lung base is identified, favor
atelectasis but developing pneumonia is not excluded. There is no
pulmonary edema or pleural effusion. The visualized skeletal
structures are stable.
IMPRESSION: Mild patchy opacities identified in the left lung base, favor
atelectasis but developing pneumonia is not excluded.

## 2021-02-07 ENCOUNTER — Other Ambulatory Visit: Payer: Self-pay | Admitting: Cardiovascular Disease

## 2021-02-07 NOTE — Telephone Encounter (Signed)
Please schedule overdue F/U appointment for refills. Thank you! 

## 2021-02-07 NOTE — Telephone Encounter (Signed)
Patient will call back to schedule ?

## 2021-09-12 NOTE — Telephone Encounter (Signed)
Attempted to schedule.  LMOV to call office.  ° °

## 2021-09-13 NOTE — Telephone Encounter (Signed)
Attempted to schedule no ans no vm ? ? ?3 attempts to schedule fu appt from recall list.   Deleting recall.  ? ?

## 2021-11-12 DIAGNOSIS — F3342 Major depressive disorder, recurrent, in full remission: Secondary | ICD-10-CM | POA: Insufficient documentation

## 2022-06-11 ENCOUNTER — Other Ambulatory Visit: Payer: Self-pay

## 2022-06-11 DIAGNOSIS — M5416 Radiculopathy, lumbar region: Secondary | ICD-10-CM

## 2022-06-13 ENCOUNTER — Other Ambulatory Visit: Payer: Self-pay | Admitting: Family Medicine

## 2022-06-13 DIAGNOSIS — M5416 Radiculopathy, lumbar region: Secondary | ICD-10-CM

## 2022-06-24 ENCOUNTER — Ambulatory Visit
Admission: RE | Admit: 2022-06-24 | Discharge: 2022-06-24 | Disposition: A | Discharge status: Home / Self Care | Payer: Medicare Other | Source: Ambulatory Visit | Attending: Family Medicine | Admitting: Family Medicine

## 2022-06-24 DIAGNOSIS — M5416 Radiculopathy, lumbar region: Secondary | ICD-10-CM

## 2022-06-30 ENCOUNTER — Other Ambulatory Visit: Payer: Self-pay | Admitting: Family Medicine

## 2022-06-30 DIAGNOSIS — N2889 Other specified disorders of kidney and ureter: Secondary | ICD-10-CM

## 2022-07-01 ENCOUNTER — Ambulatory Visit: Admission: RE | Admit: 2022-07-01 | Payer: Medicare Other | Source: Ambulatory Visit

## 2022-07-19 ENCOUNTER — Ambulatory Visit: Admission: RE | Admit: 2022-07-19 | Payer: Medicare Other | Source: Ambulatory Visit

## 2022-07-21 ENCOUNTER — Emergency Department
Admission: RE | Admit: 2022-07-21 | Discharge: 2022-07-21 | Disposition: A | Discharge status: Home / Self Care | Payer: Medicare Other | Source: Ambulatory Visit | Attending: Family Medicine | Admitting: Family Medicine

## 2022-07-21 ENCOUNTER — Emergency Department
Admission: EM | Admit: 2022-07-21 | Discharge: 2022-07-21 | Disposition: A | Discharge status: Home / Self Care | Payer: Medicare Other | Attending: Emergency Medicine | Admitting: Emergency Medicine

## 2022-07-21 ENCOUNTER — Other Ambulatory Visit: Payer: Self-pay

## 2022-07-21 DIAGNOSIS — N2889 Other specified disorders of kidney and ureter: Secondary | ICD-10-CM | POA: Insufficient documentation

## 2022-07-21 DIAGNOSIS — R0602 Shortness of breath: Secondary | ICD-10-CM | POA: Insufficient documentation

## 2022-07-21 DIAGNOSIS — M79605 Pain in left leg: Secondary | ICD-10-CM | POA: Diagnosis present

## 2022-07-21 DIAGNOSIS — M48061 Spinal stenosis, lumbar region without neurogenic claudication: Secondary | ICD-10-CM | POA: Diagnosis not present

## 2022-07-21 DIAGNOSIS — M1990 Unspecified osteoarthritis, unspecified site: Secondary | ICD-10-CM | POA: Insufficient documentation

## 2022-07-21 LAB — BASIC METABOLIC PANEL
Anion gap: 13 (ref 5–15)
BUN: 15 mg/dL (ref 8–23)
CO2: 21 mmol/L — ABNORMAL LOW (ref 22–32)
Calcium: 9.8 mg/dL (ref 8.9–10.3)
Chloride: 105 mmol/L (ref 98–111)
Creatinine, Ser: 0.91 mg/dL (ref 0.44–1.00)
GFR, Estimated: >60 mL/min (ref >60)
Glucose, Bld: 89 mg/dL (ref 70–99)
Potassium: 3.9 mmol/L (ref 3.5–5.1)
Sodium: 139 mmol/L (ref 135–145)

## 2022-07-21 LAB — CBC
HCT: 37.6 % (ref 36.0–46.0)
Hemoglobin: 12.1 g/dL (ref 12.0–15.0)
MCH: 30.2 pg (ref 26.0–34.0)
MCHC: 32.2 g/dL (ref 30.0–36.0)
MCV: 93.8 fL (ref 80.0–100.0)
Platelets: 298 10*3/uL (ref 150–400)
RBC: 4.01 MIL/uL (ref 3.87–5.11)
RDW: 13.1 % (ref 11.5–15.5)
WBC: 6.3 10*3/uL (ref 4.0–10.5)
nRBC: 0 % (ref 0.0–0.2)

## 2022-07-21 LAB — TROPONIN I (HIGH SENSITIVITY): Troponin I (High Sensitivity): 6 ng/L (ref <18)

## 2022-07-21 LAB — BRAIN NATRIURETIC PEPTIDE: B Natriuretic Peptide: 69 pg/mL (ref 0.0–100.0)

## 2022-07-21 MED ORDER — GADOBUTROL 1 MMOL/ML IV SOLN
8.0000 mL | Freq: Once | INTRAVENOUS | Status: AC | PRN
  Administered 2022-07-21: 8 mL via INTRAVENOUS

## 2022-07-21 NOTE — ED Provider Notes (Signed)
[facility] Provider Note    Event Date/Time   First MD Initiated Contact with Patient 07/21/22 2143     (approximate)   History   Back Pain and Joint Swelling   HPI  Megan Ross is a 89 y.o. female past medical history significant for dementia, lumbar stenosis, arthritis, who presents to the emergency department with pain in her knees and ankles.  History is provided by herself and her daughter at bedside.  Patient has been taking Tylenol for her arthritis.  Was complaining of significant pain to her lower legs earlier today.  Patient without complaints at this time.  Denies any recent falls or trauma.  Denies any extremity numbness or weakness.  No saddle anesthesia.  No urinary or bowel incontinence.  Denies dysuria, urinary urgency or frequency.     Physical Exam   Triage Vital Signs: ED Triage Vitals  Enc Vitals Group     BP 07/21/22 1853 (!) 197/90     Pulse Rate 07/21/22 1853 71     Resp 07/21/22 1853 18     Temp 07/21/22 1853 98.4 F (36.9 C)     Temp Source 07/21/22 1853 Oral     SpO2 07/21/22 1853 100 %     Weight 07/21/22 1851 178 lb 9.2 oz (81 kg)     Height 07/21/22 1851 5' 5"$  (1.651 m)     Head Circumference --      Peak Flow --      Pain Score 07/21/22 1851 5     Pain Loc --      Pain Edu? --      Excl. in [place]? --     Most recent vital signs: Vitals:   07/21/22 2230 07/21/22 2309  BP: (!) 172/81 (!) 151/136  Pulse: 75 75  Resp:  18  Temp:    SpO2: 99% 100%    Physical Exam Constitutional:      Appearance: She is well-developed.  HENT:     Head: Atraumatic.  Eyes:     Conjunctiva/sclera: Conjunctivae normal.  Cardiovascular:     Rate and Rhythm: Regular rhythm.  Pulmonary:     Effort: No respiratory distress.  Abdominal:     General: There is no distension.  Musculoskeletal:        General: Normal range of motion.     Cervical back: Normal range of motion.  Skin:    General: Skin is warm.  Neurological:      Mental Status: She is alert. Mental status is at baseline.     Comments: Able to ambulate to the bathroom at her baseline.  Full range of motion to bilateral lower extremities.  No overlying erythema or warmth.  No induration.     IMPRESSION / MDM / ASSESSMENT AND PLAN / ED COURSE  I reviewed the triage vital signs and the nursing notes.  Differential diagnosis including arthritis, referred pain from the lower back, lumbar stenosis, musculoskeletal.  On chart review patient has a history of lumbar stenosis.  With recent MRI that has been done in the past.    No tachycardic or bradycardic dysrhythmias while on cardiac telemetry.   LABS (all labs ordered are listed, but only abnormal results are displayed) Labs interpreted as -   Lab work without acute abnormalities.  No signs of anemia.  No signs of heart failure exacerbation. Labs Reviewed  BASIC METABOLIC PANEL - Abnormal; Notable for the following components:      Result Value  CO2 21 (*)    All other components within normal limits  CBC  BRAIN NATRIURETIC PEPTIDE  TROPONIN I (HIGH SENSITIVITY)  TROPONIN I (HIGH SENSITIVITY)    TREATMENT    MDM  Clinical picture is most concerning for arthritis and referred pain from her known lumbar stenosis.  Patient without significant pain at this time.  No concern for septic joint.  No falls or trauma and able to ambulate in the room do not feel that repeat imaging is necessary at this time.  No upper motor neuron signs, no concern for cauda equina or epidural compression syndrome do not feel that emergent MRI is necessary at this time.  Discussed symptomatic treatment.  Discussed follow-up with primary care physician and given return precautions for worsening symptoms.     PROCEDURES:  Critical Care performed: No  Procedures  Patient's presentation is most consistent with acute presentation with potential threat to life or bodily function.   MEDICATIONS ORDERED IN  ED: Medications - No data to display  FINAL CLINICAL IMPRESSION(S) / ED DIAGNOSES   Final diagnoses:  Arthritis  Spinal stenosis of lumbar region, unspecified whether neurogenic claudication present     Rx / DC Orders   ED Discharge Orders     None        Note:  This document was prepared using Dragon voice recognition software and may include unintentional dictation errors.   Nathaniel Man, MD 07/21/22 2328

## 2022-07-21 NOTE — ED Triage Notes (Signed)
Pt presents to the ED via POV due to ankle swelling and back pain. Pt;s daughter states her increase swelling in the last 3 days. Pt's daughter states she have been complaining of pain in her ankles as well with no recent trauma. Pt's daughter states she also been c/o increase back pain. Pt was seen by PCP and had a MRI completed. Daughter states attempting to manage with tylenol but not effective. PT has a hx of dementia.

## 2022-07-21 NOTE — Discharge Instructions (Signed)
Pain control:  Acetaminophen (tylenol) - You can take 2 extra strength tablets (1000 mg) every 6 hours as needed for pain.  If has ongoing pain can take Ibuprofen (motrin/aleve/advil) - You can take 3 tablets (600 mg) every 6 hours as needed for pain   You can alternate these medications or take them together.  Make sure you eat food/drink water when taking these medications.

## 2022-07-23 ENCOUNTER — Other Ambulatory Visit: Payer: Self-pay | Admitting: Family Medicine

## 2022-07-23 ENCOUNTER — Ambulatory Visit
Admission: RE | Admit: 2022-07-23 | Discharge: 2022-07-23 | Disposition: A | Discharge status: Home / Self Care | Payer: Medicare Other | Source: Ambulatory Visit | Attending: Family Medicine | Admitting: Family Medicine

## 2022-07-23 DIAGNOSIS — C642 Malignant neoplasm of left kidney, except renal pelvis: Secondary | ICD-10-CM | POA: Insufficient documentation

## 2022-07-23 MED ORDER — IOHEXOL 350 MG/ML SOLN
100.0000 mL | Freq: Once | INTRAVENOUS | Status: AC | PRN
  Administered 2022-07-23: 100 mL via INTRAVENOUS

## 2022-07-28 NOTE — Progress Notes (Signed)
Concern for possible heart failure exacerbation given shortness of breath

## 2022-07-29 ENCOUNTER — Telehealth: Payer: Self-pay | Admitting: *Deleted

## 2022-07-29 NOTE — Telephone Encounter (Signed)
Patient daughter called reporting that patient has new patient appointment with Dr Baruch Gouty and she wll be accompanyong her to appointment. Sh ewants Dr C to be aware that patient suffers form dementa, Depression and anxiety, and so if there is any bacd news that she maty now live after a period of time, please tell the daughter and NOT the patient. She would alos like for her siblings to be able to participate in the visit and I told her that she could do a conference call with them during the visit and that they could ask questions during that visit. She wanted to thank [place] for what we do for the community and looks forward to meeting [place] all

## 2022-07-30 ENCOUNTER — Other Ambulatory Visit: Payer: Self-pay | Admitting: *Deleted

## 2022-07-30 DIAGNOSIS — C649 Malignant neoplasm of unspecified kidney, except renal pelvis: Secondary | ICD-10-CM

## 2022-07-30 NOTE — Telephone Encounter (Signed)
Appointment canceled.

## 2022-07-31 ENCOUNTER — Ambulatory Visit: Payer: Medicare Other | Admitting: Radiation Oncology

## 2022-08-05 ENCOUNTER — Encounter: Payer: Self-pay | Admitting: Licensed Clinical Social Worker

## 2022-08-05 ENCOUNTER — Inpatient Hospital Stay: Payer: Medicare Other | Attending: Internal Medicine | Admitting: Internal Medicine

## 2022-08-05 ENCOUNTER — Inpatient Hospital Stay: Payer: Medicare Other

## 2022-08-05 ENCOUNTER — Encounter: Payer: Self-pay | Admitting: Internal Medicine

## 2022-08-05 VITALS — BP 142/72 | HR 72 | Resp 16 | Ht 63.0 in | Wt 158.2 lb

## 2022-08-05 DIAGNOSIS — I10 Essential (primary) hypertension: Secondary | ICD-10-CM | POA: Diagnosis not present

## 2022-08-05 DIAGNOSIS — N2889 Other specified disorders of kidney and ureter: Secondary | ICD-10-CM | POA: Insufficient documentation

## 2022-08-05 DIAGNOSIS — Z90710 Acquired absence of both cervix and uterus: Secondary | ICD-10-CM | POA: Diagnosis not present

## 2022-08-05 DIAGNOSIS — Z79899 Other long term (current) drug therapy: Secondary | ICD-10-CM | POA: Insufficient documentation

## 2022-08-05 DIAGNOSIS — F0393 Unspecified dementia, unspecified severity, with mood disturbance: Secondary | ICD-10-CM | POA: Insufficient documentation

## 2022-08-05 DIAGNOSIS — Z801 Family history of malignant neoplasm of trachea, bronchus and lung: Secondary | ICD-10-CM | POA: Insufficient documentation

## 2022-08-05 DIAGNOSIS — Z853 Personal history of malignant neoplasm of breast: Secondary | ICD-10-CM | POA: Insufficient documentation

## 2022-08-05 DIAGNOSIS — F32A Depression, unspecified: Secondary | ICD-10-CM

## 2022-08-05 DIAGNOSIS — Z803 Family history of malignant neoplasm of breast: Secondary | ICD-10-CM | POA: Diagnosis not present

## 2022-08-05 NOTE — Progress Notes (Unsigned)
New patient referred for renal cell carcinoma.  Pt has history of right breast cancer and had a mastectomy.  Pt daughter Harle Battiest is present today and reports patient has a dementia, depression and anxiety.

## 2022-08-05 NOTE — Progress Notes (Signed)
[facility] Clinical Social Work  Clinical Social Work was referred by medical provider for assessment of psychosocial needs.  Clinical Social Worker attempted to contact patient by phone  to offer support and assess for needs.  CSW left voicemail with contact information and request for return call.   FA   Jessel Gettinger, LCSW  Clinical Social Worker [facility]          

## 2022-08-07 DIAGNOSIS — N2889 Other specified disorders of kidney and ureter: Secondary | ICD-10-CM | POA: Insufficient documentation

## 2022-08-07 NOTE — Progress Notes (Signed)
Megan Ross CONSULT NOTE  Patient Care Team: Megan Harrier, MD as PCP - General (Internal Medicine) Megan Situ Kathlene November, MD as PCP - Cardiology (Cardiology)  REFERRING PROVIDER: Dr. Ginette Ross  REASON FOR REFFERAL: left renal cell mass  CANCER STAGING   Cancer Staging  No matching staging information was found for the patient.  ASSESSMENT & PLAN:  Megan Ross 89 y.o. female with pmh of dementia, anxiety, hypertension, remote history of right breast cancer, hyperlipidemia was referred to medical oncology for left renal mass.  # Left renal mass -Incidentally found on MRI lumbar spine done for back pain.  CT and MRI abdomen pelvis was reviewed.  It showed exophytic 2.6 cm left renal mass confined to the kidney without extension beyond the fascia or into the renal sinus.  No other lymphadenopathy was noted.  -I discussed in detail with the patient and the daughter Megan Ross about the imaging findings concerning for renal cancer and the management options.  Patient has multiple comorbidities, she has dementia and has ECOG status of about 3.  I discussed about 3 treatment options including partial nephrectomy which would be ideal but difficult to tolerate at her age and with her comorbidities which patient and daughter were agreeable to.  Other option includes local treatment with radio ablative techniques such as cryotherapy, RFA or SBRT.  Or active surveillance with repeat imaging to assess the growth of the mass.  The patient and daughter decided to proceed with active surveillance at this time.  I plan to repeat CT renal protocol in 8 weeks to reassess the kidney lesion.  If the mass is growing, we will consider local therapy at that time.  I will also obtain CT chest to rule out any metastatic disease. I also discussed about the role of renal biopsy which they declined.   # Dementia -On Aricept  # Hypertension -on amlodipine, losartan  # Depression-on Zoloft  Orders Placed  This Encounter  Procedures   CT RENAL ABD W/WO    Standing Status:   Future    Standing Expiration Date:   08/05/2023    Order Specific Question:   If indicated for the ordered procedure, I authorize the administration of contrast media per Radiology protocol    Answer:   Yes    Order Specific Question:   Does the patient have a contrast media/X-ray dye allergy?    Answer:   No    Order Specific Question:   Preferred imaging location?    Answer:   [facility]   CT Chest W Contrast    Standing Status:   Future    Standing Expiration Date:   08/05/2023    Order Specific Question:   If indicated for the ordered procedure, I authorize the administration of contrast media per Radiology protocol    Answer:   Yes    Order Specific Question:   Does the patient have a contrast media/X-ray dye allergy?    Answer:   No    Order Specific Question:   Preferred imaging location?    Answer:   [facility]   CBC with Differential/Platelet    Standing Status:   Future    Standing Expiration Date:   08/05/2023   Comprehensive metabolic panel    Standing Status:   Future    Standing Expiration Date:   08/05/2023   Ambulatory referral to Social Work    Referral Priority:   Routine    Referral Type:   Consultation  Referral Reason:   Specialty Services Required    Number of Visits Requested:   1   RT see in 8 weeks for MD visit, labs to discuss scans.  The total time spent in the appointment was 45 min encounter with patients including review of chart and various tests results, discussions about plan of care and coordination of care plan   All questions were answered. The patient knows to call the clinic with any problems, questions or concerns. No barriers to learning was detected.  Megan Canary, MD 3/7/202412:58 PM   HISTORY OF PRESENTING ILLNESS:  Megan Ross 89 y.o. female with pmh of dementia, anxiety, hypertension, remote history of right breast cancer, hyperlipidemia was  referred to medical oncology for left renal mass.  Patient had MRI of the lumbar spine on 06/24/2022 for chronic low back pain.  It incidentally showed a new 2.5 cm exophytic lesion in the left kidney.  This was further evaluated by CT and MR abdomen.  It showed 2.6 cm left interpolar renal mass with no extension beyond the renal fascia and into the renal sinus.  No other lymphadenopathy was noted.  Patient was seen today accompanied by her daughter Megan Ross.  Her ECOG status is about 3.  She uses walker at home.  Spends time watching TV and in couch.  Has history of dementia.  Denied any pain.  I have reviewed her chart and materials related to her cancer extensively and collaborated history with the patient. Summary of oncologic history is as follows: Oncology History   No history exists.    MEDICAL HISTORY:  Past Medical History:  Diagnosis Date   Anxiety    Arthritis    Breast cancer ([place])    right breast ca   Cancer ([place])    Dementia ([place])    Depression    Hypertension     SURGICAL HISTORY: Past Surgical History:  Procedure Laterality Date   ABDOMINAL HYSTERECTOMY     MASTECTOMY Right    breast ca    SOCIAL HISTORY: Social History   Socioeconomic History   Marital status: Widowed    Spouse name: Not on file   Number of children: Not on file   Years of education: Not on file   Highest education level: Not on file  Occupational History   Not on file  Tobacco Use   Smoking status: Never   Smokeless tobacco: Never  Substance and Sexual Activity   Alcohol use: No   Drug use: Never   Sexual activity: Not Currently  Other Topics Concern   Not on file  Social History Narrative   Not on file   Social Determinants of Health   Financial Resource Strain: Not on file  Food Insecurity: No Food Insecurity (08/05/2022)   Hunger Vital Sign    Worried About Running Out of Food in the Last Year: Never true    Ran Out of Food in the Last Year: Never true  Transportation  Needs: No Transportation Needs (08/05/2022)   PRAPARE - Hydrologist (Medical): No    Lack of Transportation (Non-Medical): No  Physical Activity: Not on file  Stress: Not on file  Social Connections: Not on file  Intimate Partner Violence: Not At Risk (08/05/2022)   Humiliation, Afraid, Rape, and Kick questionnaire    Fear of Current or Ex-Partner: No    Emotionally Abused: No    Physically Abused: No    Sexually Abused: No    FAMILY  HISTORY: Family History  Problem Relation Age of Onset   Breast cancer Sister    Lung cancer Brother    Cancer Brother        unknow    ALLERGIES:  is allergic to lisinopril.  MEDICATIONS:  Current Outpatient Medications  Medication Sig Dispense Refill   amLODipine (NORVASC) 2.5 MG tablet Take by mouth.     donepezil (ARICEPT) 5 MG tablet Take 5 mg by mouth daily.     dorzolamide (TRUSOPT) 2 % ophthalmic solution Place 1 drop into both eyes 2 (two) times daily.   4   fluticasone (FLONASE) 50 MCG/ACT nasal spray Place 2 sprays into both nostrils daily.   4   hydrochlorothiazide (MICROZIDE) 12.5 MG capsule Take 12.5 mg by mouth daily.  4   latanoprost (XALATAN) 0.005 % ophthalmic solution Place 1 drop into both eyes at bedtime.  4   losartan (COZAAR) 50 MG tablet Take 50 mg by mouth daily.     montelukast (SINGULAIR) 10 MG tablet Take 10 mg by mouth at bedtime.  4   omeprazole (PRILOSEC) 20 MG capsule Take 20 mg by mouth daily.     potassium chloride (KLOR-CON) 10 MEQ tablet TAKE 1 TABLET BY MOUTH EVERY DAY 90 tablet 0   sertraline (ZOLOFT) 50 MG tablet Take by mouth.     simvastatin (ZOCOR) 20 MG tablet Take 20 mg by mouth daily.  0   timolol (TIMOPTIC) 0.5 % ophthalmic solution Place 1 drop into both eyes 2 (two) times daily.   4   traMADol (ULTRAM) 50 MG tablet Take 50 mg by mouth 2 (two) times daily as needed. (Patient not taking: Reported on 08/05/2022)     No current facility-administered medications for this visit.     REVIEW OF SYSTEMS:   Pertinent information mentioned in HPI All other systems were reviewed with the patient and are negative.  PHYSICAL EXAMINATION: ECOG PERFORMANCE STATUS: 3 - Symptomatic, >50% confined to bed  Vitals:   08/05/22 1443  BP: (!) 142/72  Pulse: 72  Resp: 16  SpO2: 98%   Filed Weights   08/05/22 1443  Weight: 158 lb 3.2 oz (71.8 kg)    GENERAL:alert, no distress and comfortable SKIN: skin color, texture, turgor are normal, no rashes or significant lesions EYES: normal, conjunctiva are pink and non-injected, sclera clear OROPHARYNX:no exudate, no erythema and lips, buccal mucosa, and tongue normal  NECK: supple, thyroid normal size, non-tender, without nodularity LYMPH:  no palpable lymphadenopathy in the cervical, axillary or inguinal LUNGS: clear to auscultation and percussion with normal breathing effort HEART: regular rate & rhythm and no murmurs and no lower extremity edema ABDOMEN:abdomen soft, non-tender and normal bowel sounds Musculoskeletal:no cyanosis of digits and no clubbing  PSYCH: alert & oriented x 3 with fluent speech NEURO: no focal motor/sensory deficits  LABORATORY DATA:  I have reviewed the data as listed Lab Results  Component Value Date   WBC 6.3 07/21/2022   HGB 12.1 07/21/2022   HCT 37.6 07/21/2022   MCV 93.8 07/21/2022   PLT 298 07/21/2022   Recent Labs    07/21/22 1935  NA 139  K 3.9  CL 105  CO2 21*  GLUCOSE 89  BUN 15  CREATININE 0.91  CALCIUM 9.8  GFRNONAA >60    RADIOGRAPHIC STUDIES: I have personally reviewed the radiological images as listed and agreed with the findings in the report. CT ABDOMEN PELVIS W WO CONTRAST  Result Date: 07/23/2022 CLINICAL DATA:  Left renal cell  carcinoma * Tracking Code: BO * EXAM: CT ABDOMEN AND PELVIS WITHOUT AND WITH CONTRAST TECHNIQUE: Multidetector CT imaging of the abdomen and pelvis was performed following the standard protocol before and following the bolus  administration of intravenous contrast. RADIATION DOSE REDUCTION: This exam was performed according to the departmental dose-optimization program which includes automated exposure control, adjustment of the mA and/or kV according to patient size and/or use of iterative reconstruction technique. CONTRAST:  119m OMNIPAQUE IOHEXOL 350 MG/ML SOLN COMPARISON:  MRI 07/21/2022 FINDINGS: Lower chest: Descending thoracic aortic atherosclerosis. Hepatobiliary: Gallbladder absent. Common bile duct 8 mm in diameter, within normal limits for age. No significant hepatic parenchymal lesion identified. Pancreas: Mild dorsal pancreatic duct dilatation, no discrete cause identified. Spleen: Unremarkable Adrenals/Urinary Tract: Both adrenal glands appear normal. Enhancing exophytic 2.7 by 2.7 cm left mid kidney mass posterolaterally compatible with renal cell carcinoma, image 53 series 11. Additional bilateral Bosniak category 1 and category 2 cysts are not enhancing and warrant no further workup. Notably, below the level of the renal cell carcinoma, there is a bilobed 7.2 by 5.0 by 5.8 cm cyst containing a single internal thin septation (Bosniak category 2). No urinary tract calculus identified. Diffuse wall thickening of the urinary bladder may partially be due to nondistention, cannot exclude cystitis. Stomach/Bowel: Descending and sigmoid colon diverticulosis. Vascular/Lymphatic: Mild atheromatous narrowing at the origins of the celiac trunk and superior mesenteric artery. Patent single bilateral renal arteries. Atherosclerosis is present, including aortoiliac atherosclerotic disease. No pathologic adenopathy. No tumor thrombus identified in the left renal vein. Reproductive: Uterus absent. Adnexa unremarkable, fluid density along the left adnexa is thought to be in a loop of bowel rather than a cyst or hydrosalpinx. Other: No supplemental non-categorized findings. Musculoskeletal: Lower thoracic and lumbar spondylosis and  degenerative disc disease with loss of disc height most notable at the L3-4 level. Grade 1 degenerative anterolisthesis at L5-S1 with resulting bilateral foraminal stenosis related to disc bulge and disc uncovering. IMPRESSION: 1. 2.7 cm enhancing exophytic left mid kidney mass compatible with renal cell carcinoma. No findings of metastatic disease. 2. Additional benign renal cysts bilaterally warrant no further independent workup. 3. Mild dorsal pancreatic duct dilatation, no discrete cause identified. 4. Diffuse wall thickening of the urinary bladder may partially be due to nondistention, cannot exclude cystitis. 5. Descending and sigmoid colon diverticulosis. 6. Lower thoracic and lumbar spondylosis and degenerative disc disease. Bilateral foraminal stenosis at L5-S1 due to disc bulge and disc uncovering. 7. Aortic atherosclerosis. Aortic Atherosclerosis (ICD10-I70.0). Electronically Signed   By: WVan ClinesM.D.   On: 07/23/2022 16:26   MR ABDOMEN WWO CONTRAST  Result Date: 07/21/2022 CLINICAL DATA:  Further evaluation of left renal lesion seen on lumbar spine MRI EXAM: MRI ABDOMEN WITHOUT AND WITH CONTRAST TECHNIQUE: Multiplanar multisequence MR imaging of the abdomen was performed both before and after the administration of intravenous contrast. CONTRAST:  84mGADAVIST GADOBUTROL 1 MMOL/ML IV SOLN COMPARISON:  Lumbar spine MRI June 24, 2022 and CT abdomen pelvis May 13 2007. FINDINGS: Despite efforts by the technologist and patient, motion artifact is present on today's exam and could not be eliminated. This reduces exam sensitivity and specificity. Lower chest: No acute findings. Hepatobiliary: No suspicious hepatic lesion. Gallbladder is not identified and may be decompressed or surgically absent. Prominence of the biliary tree is within normal limits for patient's age. Pancreas: Focal prominence of the pancreatic duct measuring 5 mm in the pancreatic head on image 19/7 and measuring  approximally 15 mm in length  of the duct with normal caliber duct both proximally and distally to this. No evidence of acute pancreatic inflammation. Spleen:  No splenomegaly. Adrenals/Urinary Tract: Bilateral adrenal glands are within normal limits. Heterogeneously enhancing partially exophytic 2.6 x 2.6 cm left interpolar renal lesion on image 36/15 extends to the posterior renal fascia without definite extension beyond but does not extend into the renal sinus. Additional T2 hyperintense bilateral renal lesions of varying degrees of complexity, some of which are compatible with benign cysts including a 7.1 cm left lower pole renal cyst which has a thin internal septation. Other T2 hyperintense renal lesions which are difficult to definitively characterize given their small size and degree of motion on the examination are favored to reflect cysts. No hydronephrosis Stomach/Bowel: Pancolonic diverticulosis without findings of acute diverticulitis. No evidence of bowel obstruction Vascular/Lymphatic: Aortic atherosclerosis. Smooth IVC contours. No pathologically enlarged abdominal lymph nodes identified. Other: Sequela of subcutaneous injections in the anterior abdominal wall. Possible left-sided hydrosalpinx and left ovarian cyst only seen on coronal imaging for instance on image 12/3. Trabecular thickening of the urinary bladder wall. Musculoskeletal: No suspicious bone lesions identified. Multilevel degenerative change of the spine. IMPRESSION: Severely motion degraded examination.  Within this context: 1. Heterogeneously enhancing partially exophytic 2.6 cm left interpolar renal lesion, compatible with renal cell carcinoma. No definite evidence of renal tumor in vein or abdominal metastasis. 2. Multiple additional renal lesions are favored to reflect cysts but some of which are incompletely evaluated on this motion degraded examination, consider more definitive assessment by renal protocol CT of the  abdomen/pelvis with and without contrast. 3. Focal prominence of the pancreatic duct measuring 5 mm in the pancreatic head and measuring approximally 15 mm in length of the duct with normal caliber duct both proximally and distally to this. No evidence of acute pancreatic inflammation, nonspecific and not well characterized on this degraded examination but possibly reflecting a main branch IPMN. Recommend attention on follow-up CT imaging mentioned above. 4. Possible left-sided hydrosalpinx and left ovarian cyst. Recommend further evaluation with pelvic ultrasound. 5. Pancolonic diverticulosis without findings of acute diverticulitis. Electronically Signed   By: Dahlia Bailiff M.D.   On: 07/21/2022 18:24

## 2022-08-08 ENCOUNTER — Inpatient Hospital Stay: Payer: Medicare Other | Admitting: Licensed Clinical Social Worker

## 2022-08-08 DIAGNOSIS — N2889 Other specified disorders of kidney and ureter: Secondary | ICD-10-CM

## 2022-08-08 NOTE — Progress Notes (Signed)
[facility]  Initial Assessment   Megan Ross is a 89 y.o. year old female contacted by phone. Clinical Social Work was referred by medical provider for assessment of psychosocial needs.   SDOH (Social Determinants of Health) assessments performed: Yes SDOH Interventions    Flowsheet Row Clinical Support from 08/08/2022 in [facility] Interventions   Alcohol Usage Interventions Intervention Not Indicated (Score <7)  Financial Strain Interventions Intervention Not Indicated  Physical Activity Interventions Intervention Not Indicated  Stress Interventions Intervention Not Indicated  Social Connections Interventions Intervention Not Indicated       SDOH Screenings   Food Insecurity: No Food Insecurity (08/05/2022)  Housing: Low Risk  (08/05/2022)  Transportation Needs: No Transportation Needs (08/05/2022)  Utilities: Not At Risk (08/05/2022)  Alcohol Screen: Low Risk  (08/08/2022)  Depression (PHQ2-9): Low Risk  (08/05/2022)  Financial Resource Strain: Low Risk  (08/08/2022)  Physical Activity: Inactive (08/08/2022)  Social Connections: Moderately Isolated (08/08/2022)  Stress: No Stress Concern Present (08/08/2022)  Tobacco Use: Low Risk  (08/05/2022)     Distress Screen completed: No    08/05/2022    2:29 PM  ONCBCN DISTRESS SCREENING  Screening Type Initial Screening  Distress experienced in past week (1-10) 0      Family/Social Information:  Housing Arrangement: patient lives with adult son, main contact and [place], [place]  Family members/support persons in your life? Family, Friends, and Geophysical data processor concerns: no  Employment: Retired  .  Income source: Paediatric nurse concerns: No Type of concern: None Food access concerns: no Religious or spiritual practice: Yes-  Services Currently in place:  BCBS Medicare  Coping/ Adjustment to diagnosis: Patient understands  treatment plan and what happens next? no, patient has dx of dementia, patient is pending diagnosis and treatment plan Concerns about diagnosis and/or treatment: How I will pay for the services I need, Quality of life, and Caregiving , patient does not meet Medicaid criteria Patient reported stressors: Childcare/ elder care, Depression, Anxiety/ nervousness, and Physical issues Hopes and/or priorities: to find caregiving assistance for the patient Patient enjoys  N/A Current coping skills/ strengths: Scientist, research (life sciences) , Supportive family/friends , and Other: patient has dx of dementia and depression    SUMMARY: Current SDOH Barriers:  Financial constraints related to fixed income, Level of care concerns, ADL IADL limitations, Mental Health Concerns , Cognitive Deficits, and Memory Deficits  Clinical Social Work Clinical Goal(s):  No clinical social work goals at this time  Interventions: Discussed common feeling and emotions when being diagnosed with cancer, and the importance of support during treatment Informed patient of the support team roles and support services at [facility] Provided [place] contact information and encouraged patient to call with any questions or concerns Referred patient to [facility] Medicaid, [employer] ([employer])  and Provided patient with information about CSW role in patient care and other available resources.  CSW will email information on private caregivers to [email or web address]   Follow Up Plan: Patient will contact CSW with any support or resource needs Patient verbalizes understanding of plan: Yes    Sara Keys, LCSW

## 2022-08-19 ENCOUNTER — Ambulatory Visit
Admission: RE | Admit: 2022-08-19 | Discharge: 2022-08-19 | Disposition: A | Discharge status: Home / Self Care | Payer: Medicare Other | Source: Ambulatory Visit | Attending: Internal Medicine | Admitting: Internal Medicine

## 2022-08-19 DIAGNOSIS — N2889 Other specified disorders of kidney and ureter: Secondary | ICD-10-CM | POA: Diagnosis not present

## 2022-08-19 DIAGNOSIS — C649 Malignant neoplasm of unspecified kidney, except renal pelvis: Secondary | ICD-10-CM | POA: Diagnosis not present

## 2022-08-19 DIAGNOSIS — I700 Atherosclerosis of aorta: Secondary | ICD-10-CM | POA: Diagnosis not present

## 2022-08-19 DIAGNOSIS — R911 Solitary pulmonary nodule: Secondary | ICD-10-CM | POA: Diagnosis not present

## 2022-08-19 DIAGNOSIS — Z853 Personal history of malignant neoplasm of breast: Secondary | ICD-10-CM | POA: Diagnosis not present

## 2022-08-19 MED ORDER — IOHEXOL 300 MG/ML  SOLN
75.0000 mL | Freq: Once | INTRAMUSCULAR | Status: AC | PRN
  Administered 2022-08-19: 75 mL via INTRAVENOUS

## 2022-09-17 ENCOUNTER — Inpatient Hospital Stay (HOSPITAL_BASED_OUTPATIENT_CLINIC_OR_DEPARTMENT_OTHER): Payer: Medicare Other | Admitting: Hospice and Palliative Medicine

## 2022-09-17 DIAGNOSIS — N2889 Other specified disorders of kidney and ureter: Secondary | ICD-10-CM

## 2022-09-17 NOTE — Progress Notes (Signed)
Multidisciplinary Oncology Council Documentation  Megan Ross was presented by our [facility] on 09/17/2022, which included representatives from:  Palliative Care Dietitian  Physical/Occupational Therapist Nurse Navigator Genetics Speech Therapist Social work Survivorship RN Financial Navigator Research RN   Megan Ross currently presents with history of breast cancer  We reviewed previous medical and familial history, history of present illness, and recent lab results along with all available histopathologic and imaging studies. The MOC considered available treatment options and made the following recommendations/referrals:  Consider palliative care  The MOC is a meeting of clinicians from various specialty areas who evaluate and discuss patients for whom a multidisciplinary approach is being considered. Final determinations in the plan of care are those of the provider(s).   Today's extended care, comprehensive team conference, Megan Ross was not present for the discussion and was not examined.

## 2022-09-19 ENCOUNTER — Ambulatory Visit
Admission: RE | Admit: 2022-09-19 | Discharge: 2022-09-19 | Disposition: A | Discharge status: Home / Self Care | Payer: Medicare Other | Source: Ambulatory Visit | Attending: Internal Medicine | Admitting: Internal Medicine

## 2022-09-19 DIAGNOSIS — N2889 Other specified disorders of kidney and ureter: Secondary | ICD-10-CM | POA: Diagnosis present

## 2022-09-19 MED ORDER — IOHEXOL 300 MG/ML  SOLN
100.0000 mL | Freq: Once | INTRAMUSCULAR | Status: AC | PRN
  Administered 2022-09-19: 100 mL via INTRAVENOUS

## 2022-09-23 ENCOUNTER — Encounter: Payer: Self-pay | Admitting: Internal Medicine

## 2022-09-23 ENCOUNTER — Inpatient Hospital Stay: Payer: Medicare Other | Admitting: Internal Medicine

## 2022-09-23 ENCOUNTER — Inpatient Hospital Stay: Payer: Medicare Other | Attending: Internal Medicine

## 2022-09-23 VITALS — BP 161/85 | HR 67 | Temp 97.6°F | Resp 20 | Wt 158.8 lb

## 2022-09-23 DIAGNOSIS — N2889 Other specified disorders of kidney and ureter: Secondary | ICD-10-CM | POA: Diagnosis present

## 2022-09-23 DIAGNOSIS — F32A Depression, unspecified: Secondary | ICD-10-CM | POA: Insufficient documentation

## 2022-09-23 DIAGNOSIS — I10 Essential (primary) hypertension: Secondary | ICD-10-CM | POA: Insufficient documentation

## 2022-09-23 DIAGNOSIS — Z853 Personal history of malignant neoplasm of breast: Secondary | ICD-10-CM | POA: Diagnosis not present

## 2022-09-23 DIAGNOSIS — Z9011 Acquired absence of right breast and nipple: Secondary | ICD-10-CM | POA: Insufficient documentation

## 2022-09-23 DIAGNOSIS — Z79899 Other long term (current) drug therapy: Secondary | ICD-10-CM | POA: Diagnosis not present

## 2022-09-23 DIAGNOSIS — Z90710 Acquired absence of both cervix and uterus: Secondary | ICD-10-CM | POA: Insufficient documentation

## 2022-09-23 DIAGNOSIS — F0390 Unspecified dementia without behavioral disturbance: Secondary | ICD-10-CM | POA: Diagnosis not present

## 2022-09-23 DIAGNOSIS — Z801 Family history of malignant neoplasm of trachea, bronchus and lung: Secondary | ICD-10-CM | POA: Diagnosis not present

## 2022-09-23 DIAGNOSIS — Z803 Family history of malignant neoplasm of breast: Secondary | ICD-10-CM | POA: Insufficient documentation

## 2022-09-23 LAB — CBC WITH DIFFERENTIAL/PLATELET
Abs Immature Granulocytes: 0 10*3/uL (ref 0.00–0.07)
Basophils Absolute: 0.1 10*3/uL (ref 0.0–0.1)
Basophils Relative: 1 %
Eosinophils Absolute: 0.1 10*3/uL (ref 0.0–0.5)
Eosinophils Relative: 3 %
HCT: 37.4 % (ref 36.0–46.0)
Hemoglobin: 11.9 g/dL — ABNORMAL LOW (ref 12.0–15.0)
Immature Granulocytes: 0 %
Lymphocytes Relative: 30 %
Lymphs Abs: 1.6 10*3/uL (ref 0.7–4.0)
MCH: 29.9 pg (ref 26.0–34.0)
MCHC: 31.8 g/dL (ref 30.0–36.0)
MCV: 94 fL (ref 80.0–100.0)
Monocytes Absolute: 0.7 10*3/uL (ref 0.1–1.0)
Monocytes Relative: 12 %
Neutro Abs: 2.9 10*3/uL (ref 1.7–7.7)
Neutrophils Relative %: 54 %
Platelets: 336 10*3/uL (ref 150–400)
RBC: 3.98 MIL/uL (ref 3.87–5.11)
RDW: 13.5 % (ref 11.5–15.5)
WBC: 5.3 10*3/uL (ref 4.0–10.5)
nRBC: 0 % (ref 0.0–0.2)

## 2022-09-23 LAB — COMPREHENSIVE METABOLIC PANEL
ALT: 12 U/L (ref 0–44)
AST: 15 U/L (ref 15–41)
Albumin: 4.5 g/dL (ref 3.5–5.0)
Alkaline Phosphatase: 47 U/L (ref 38–126)
Anion gap: 8 (ref 5–15)
BUN: 23 mg/dL (ref 8–23)
CO2: 26 mmol/L (ref 22–32)
Calcium: 9.7 mg/dL (ref 8.9–10.3)
Chloride: 102 mmol/L (ref 98–111)
Creatinine, Ser: 1.02 mg/dL — ABNORMAL HIGH (ref 0.44–1.00)
GFR, Estimated: 54 mL/min — ABNORMAL LOW (ref >60)
Glucose, Bld: 98 mg/dL (ref 70–99)
Potassium: 4.3 mmol/L (ref 3.5–5.1)
Sodium: 136 mmol/L (ref 135–145)
Total Bilirubin: 1.1 mg/dL (ref 0.3–1.2)
Total Protein: 7.5 g/dL (ref 6.5–8.1)

## 2022-09-23 NOTE — Progress Notes (Signed)
[facility] CONSULT NOTE  Patient Care Team: Barbette Reichmann, MD as PCP - General (Internal Medicine) Antonieta Iba, MD as PCP - Cardiology (Cardiology)  CANCER STAGING   Cancer Staging  No matching staging information was found for the patient.  ASSESSMENT & PLAN:  Megan Ross 89 y.o. female with pmh of dementia, anxiety, hypertension, remote history of right breast cancer, hyperlipidemia was referred to medical oncology for left renal mass.  # Left renal mass -Incidentally found on MRI lumbar spine done for back pain.  CT and MRI abdomen pelvis from February 2024 showed exophytic 2.6 cm left renal mass confined to the kidney without extension beyond the fascia or into the renal sinus.  No other lymphadenopathy was noted.  - CT chest done for staging did not show any metastatic disease. There are 2 small nodules in the superior segment of the right lower lobe which are not clearly seen on the prior study, although that examination was performed with thicker collimation and there was more dependent atelectasis in the lungs which could have obscured small nodules. Given the patient's history, attention on follow-up CT in 6-12 months suggested.  -During last visit, the plan was to proceed with surveillance considering her advanced age, dementia and other comorbidities.  Repeat CT renal protocol from 09/19/2022 done 8 weeks after the initial scan showed stable left kidney lesion.  Imaging findings were discussed with the patient and the daughter.  Considering the left renal mass is stable in size, I will extend the repeat imaging to 78-month interval which can be moved to 60-month interval if the scan remains stable.  Labs reviewed and unremarkable.  # Dementia -On Aricept  # Hypertension -on amlodipine, losartan  # Depression-on Zoloft  Orders Placed This Encounter  Procedures   CT RENAL ABD W/WO    Standing Status:   Future    Standing Expiration Date:   09/23/2023     Scheduling Instructions:     Schedule in 4 months.    Order Specific Question:   If indicated for the ordered procedure, I authorize the administration of contrast media per Radiology protocol    Answer:   Yes    Order Specific Question:   Does the patient have a contrast media/X-ray dye allergy?    Answer:   No    Order Specific Question:   Preferred imaging location?    Answer:   [facility]   RT see in 4 months for MD visit, discuss scan.  The total time spent in the appointment was 30 min encounter with patients including review of chart and various tests results, discussions about plan of care and coordination of care plan   All questions were answered. The patient knows to call the clinic with any problems, questions or concerns. No barriers to learning was detected.  Megan Barter, MD 4/23/20241:48 PM   HISTORY OF PRESENTING ILLNESS:  Megan Ross 89 y.o. female with pmh of dementia, anxiety, hypertension, remote history of right breast cancer, hyperlipidemia was referred to medical oncology for left renal mass.  Patient had MRI of the lumbar spine on 06/24/2022 for chronic low back pain.  It incidentally showed a new 2.5 cm exophytic lesion in the left kidney.  This was further evaluated by CT and MR abdomen.  It showed 2.6 cm left interpolar renal mass with no extension beyond the renal fascia and into the renal sinus.  No other lymphadenopathy was noted.  Patient was seen today accompanied  by her daughter Megan Ross.  Her ECOG status is about 3.  She uses walker at home.  Spends time watching TV and in couch.  Has history of dementia.    Interval history- Patient was seen today accompanied by daughter to discuss CT imaging.  She is on surveillance.  Patient has been feeling well.  Denies any abdominal pain, changes in bowel movement or urination.  Denies any blood in urine.  I have reviewed her chart and materials related to her cancer extensively and collaborated  history with the patient. Summary of oncologic history is as follows: Oncology History   No history exists.    MEDICAL HISTORY:  Past Medical History:  Diagnosis Date   Anxiety    Arthritis    Breast cancer    right breast ca   Cancer    Dementia    Depression    Hypertension     SURGICAL HISTORY: Past Surgical History:  Procedure Laterality Date   ABDOMINAL HYSTERECTOMY     MASTECTOMY Right    breast ca    SOCIAL HISTORY: Social History   Socioeconomic History   Marital status: Widowed    Spouse name: Not on file   Number of children: Not on file   Years of education: Not on file   Highest education level: Not on file  Occupational History   Not on file  Tobacco Use   Smoking status: Never   Smokeless tobacco: Never  Substance and Sexual Activity   Alcohol use: No   Drug use: Never   Sexual activity: Not Currently  Other Topics Concern   Not on file  Social History Narrative   Not on file   Social Determinants of Health   Financial Resource Strain: Low Risk  (08/08/2022)   Overall Financial Resource Strain (CARDIA)    Difficulty of Paying Living Expenses: Not very hard  Food Insecurity: No Food Insecurity (08/05/2022)   Hunger Vital Sign    Worried About Running Out of Food in the Last Year: Never true    Ran Out of Food in the Last Year: Never true  Transportation Needs: No Transportation Needs (08/05/2022)   PRAPARE - Administrator, Civil Service (Medical): No    Lack of Transportation (Non-Medical): No  Physical Activity: Inactive (08/08/2022)   Exercise Vital Sign    Days of Exercise per Week: 0 days    Minutes of Exercise per Session: 0 min  Stress: No Stress Concern Present (08/08/2022)   [employer] of Occupational Health - Occupational Stress Questionnaire    Feeling of Stress : Only a little  Social Connections: Moderately Isolated (08/08/2022)   Social Connection and Isolation Panel [NHANES]    Frequency of Communication with  Friends and Family: More than three times a week    Frequency of Social Gatherings with Friends and Family: Not on file    Attends Religious Services: More than 4 times per year    Active Member of [employer] or Organizations: No    Attends Banker Meetings: Never    Marital Status: Widowed  Intimate Partner Violence: Not At Risk (08/05/2022)   Humiliation, Afraid, Rape, and Kick questionnaire    Fear of Current or Ex-Partner: No    Emotionally Abused: No    Physically Abused: No    Sexually Abused: No    FAMILY HISTORY: Family History  Problem Relation Age of Onset   Breast cancer Sister    Lung cancer Brother  Cancer Brother        unknow    ALLERGIES:  is allergic to lisinopril.  MEDICATIONS:  Current Outpatient Medications  Medication Sig Dispense Refill   amLODipine (NORVASC) 2.5 MG tablet Take by mouth.     donepezil (ARICEPT) 5 MG tablet Take 5 mg by mouth daily.     dorzolamide (TRUSOPT) 2 % ophthalmic solution Place 1 drop into both eyes 2 (two) times daily.   4   fluticasone (FLONASE) 50 MCG/ACT nasal spray Place 2 sprays into both nostrils daily.   4   hydrochlorothiazide (MICROZIDE) 12.5 MG capsule Take 12.5 mg by mouth daily.  4   latanoprost (XALATAN) 0.005 % ophthalmic solution Place 1 drop into both eyes at bedtime.  4   losartan (COZAAR) 50 MG tablet Take 50 mg by mouth daily.     montelukast (SINGULAIR) 10 MG tablet Take 10 mg by mouth at bedtime.  4   omeprazole (PRILOSEC) 20 MG capsule Take 20 mg by mouth daily.     potassium chloride (KLOR-CON) 10 MEQ tablet TAKE 1 TABLET BY MOUTH EVERY DAY 90 tablet 0   sertraline (ZOLOFT) 50 MG tablet Take by mouth.     simvastatin (ZOCOR) 20 MG tablet Take 20 mg by mouth daily.  0   timolol (TIMOPTIC) 0.5 % ophthalmic solution Place 1 drop into both eyes 2 (two) times daily.   4   traMADol (ULTRAM) 50 MG tablet Take 50 mg by mouth 2 (two) times daily as needed. (Patient not taking: Reported on 08/05/2022)      No current facility-administered medications for this visit.    REVIEW OF SYSTEMS:   Pertinent information mentioned in HPI All other systems were reviewed with the patient and are negative.  PHYSICAL EXAMINATION: ECOG PERFORMANCE STATUS: 3 - Symptomatic, >50% confined to bed  Vitals:   09/23/22 1318  BP: (!) 161/85  Pulse: 67  Resp: 20  Temp: 97.6 F (36.4 C)  SpO2: 100%    Filed Weights   09/23/22 1318  Weight: 158 lb 12.8 oz (72 kg)     GENERAL:alert, no distress and comfortable SKIN: skin color, texture, turgor are normal, no rashes or significant lesions EYES: normal, conjunctiva are pink and non-injected, sclera clear OROPHARYNX:no exudate, no erythema and lips, buccal mucosa, and tongue normal  NECK: supple, thyroid normal size, non-tender, without nodularity LYMPH:  no palpable lymphadenopathy in the cervical, axillary or inguinal LUNGS: clear to auscultation and percussion with normal breathing effort HEART: regular rate & rhythm and no murmurs and no lower extremity edema ABDOMEN:abdomen soft, non-tender and normal bowel sounds Musculoskeletal:no cyanosis of digits and no clubbing  PSYCH: alert & oriented x 3 with fluent speech NEURO: no focal motor/sensory deficits  LABORATORY DATA:  I have reviewed the data as listed Lab Results  Component Value Date   WBC 5.3 09/23/2022   HGB 11.9 (L) 09/23/2022   HCT 37.4 09/23/2022   MCV 94.0 09/23/2022   PLT 336 09/23/2022   Recent Labs    07/21/22 1935 09/23/22 1301  NA 139 136  K 3.9 4.3  CL 105 102  CO2 21* 26  GLUCOSE 89 98  BUN 15 23  CREATININE 0.91 1.02*  CALCIUM 9.8 9.7  GFRNONAA >60 54*  PROT  --  7.5  ALBUMIN  --  4.5  AST  --  15  ALT  --  12  ALKPHOS  --  47  BILITOT  --  1.1    RADIOGRAPHIC STUDIES:  I have personally reviewed the radiological images as listed and agreed with the findings in the report. CT RENAL ABD W/WO  Result Date: 09/22/2022 CLINICAL DATA:  Presumed left  renal cell carcinoma surveillance * Tracking Code: BO * EXAM: CT ABDOMEN WITHOUT AND WITH CONTRAST TECHNIQUE: Multidetector CT imaging of the abdomen was performed following the standard protocol before and following the bolus administration of intravenous contrast. RADIATION DOSE REDUCTION: This exam was performed according to the departmental dose-optimization program which includes automated exposure control, adjustment of the mA and/or kV according to patient size and/or use of iterative reconstruction technique. CONTRAST:  OMNIPAQUE IOHEXOL 300 MG/ML  SOLN COMPARISON:  07/23/2022 FINDINGS: Lower chest: No acute abnormality. Partially imaged right mastectomy. Hepatobiliary: No focal liver abnormality is seen. Status post cholecystectomy. No biliary dilatation. Pancreas: Unremarkable. No pancreatic ductal dilatation or surrounding inflammatory changes. Spleen: Normal in size without significant abnormality. Adrenals/Urinary Tract: Adrenal glands are unremarkable. Unchanged contrast enhancing exophytic mass of the posterior midportion of the left kidney measuring 2.8 x 2.4 cm (series 8, image 70). Multiple bilateral benign renal cortical cysts, for which no specific further follow-up or characterization is required. No calculi or hydronephrosis. Stomach/Bowel: Stomach is within normal limits. No evidence of bowel wall thickening, distention, or inflammatory changes. Pancolonic diverticulosis. Vascular/Lymphatic: Aortic atherosclerosis. No enlarged abdominal lymph nodes. Other: No abdominal wall hernia or abnormality. No ascites. Musculoskeletal: No acute or significant osseous findings. IMPRESSION: 1. Unchanged contrast enhancing exophytic mass of the posterior midportion of the left kidney measuring 2.8 x 2.4 cm, consistent with renal cell carcinoma. 2. No evidence of renal vein invasion, lymphadenopathy or metastatic disease in the abdomen. 3. Colonic diverticulosis. Aortic Atherosclerosis (ICD10-I70.0).  Electronically Signed   By: Jearld Lesch M.D.   On: 09/22/2022 19:01

## 2022-09-23 NOTE — Progress Notes (Signed)
Patient has no concerns 

## 2022-10-01 ENCOUNTER — Inpatient Hospital Stay: Payer: Medicare Other | Attending: Internal Medicine | Admitting: Hospice and Palliative Medicine

## 2022-10-01 DIAGNOSIS — N2889 Other specified disorders of kidney and ureter: Secondary | ICD-10-CM

## 2022-10-01 NOTE — Progress Notes (Signed)
Multidisciplinary Oncology Council Documentation  Megan Ross was presented by our [facility] on 10/01/2022, which included representatives from:  Palliative Care Dietitian  Physical/Occupational Therapist Nurse Navigator Genetics Speech Therapist Social work Survivorship RN Financial Navigator Research RN   Megan Ross currently presents with history of renal mass  We reviewed previous medical and familial history, history of present illness, and recent lab results along with all available histopathologic and imaging studies. The MOC considered available treatment options and made the following recommendations/referrals:  None currently  The MOC is a meeting of clinicians from various specialty areas who evaluate and discuss patients for whom a multidisciplinary approach is being considered. Final determinations in the plan of care are those of the provider(s).   Today's extended care, comprehensive team conference, Megan Ross was not present for the discussion and was not examined.

## 2023-01-12 ENCOUNTER — Other Ambulatory Visit: Payer: Medicare Other

## 2023-01-12 ENCOUNTER — Ambulatory Visit
Admission: RE | Admit: 2023-01-12 | Discharge: 2023-01-12 | Disposition: A | Discharge status: Home / Self Care | Payer: Medicare Other | Source: Ambulatory Visit | Attending: Internal Medicine | Admitting: Internal Medicine

## 2023-01-12 DIAGNOSIS — N2889 Other specified disorders of kidney and ureter: Secondary | ICD-10-CM | POA: Insufficient documentation

## 2023-01-12 MED ORDER — IOHEXOL 300 MG/ML  SOLN
100.0000 mL | Freq: Once | INTRAMUSCULAR | Status: AC | PRN
  Administered 2023-01-12: 100 mL via INTRAVENOUS

## 2023-01-23 ENCOUNTER — Ambulatory Visit: Payer: Medicare Other | Admitting: Internal Medicine

## 2023-01-30 ENCOUNTER — Inpatient Hospital Stay: Payer: Medicare Other | Attending: Internal Medicine | Admitting: Internal Medicine

## 2023-01-30 VITALS — BP 146/70 | HR 64 | Temp 96.7°F | Wt 160.0 lb

## 2023-01-30 DIAGNOSIS — Z853 Personal history of malignant neoplasm of breast: Secondary | ICD-10-CM | POA: Diagnosis not present

## 2023-01-30 DIAGNOSIS — Z79899 Other long term (current) drug therapy: Secondary | ICD-10-CM | POA: Diagnosis not present

## 2023-01-30 DIAGNOSIS — Z803 Family history of malignant neoplasm of breast: Secondary | ICD-10-CM | POA: Diagnosis not present

## 2023-01-30 DIAGNOSIS — F0393 Unspecified dementia, unspecified severity, with mood disturbance: Secondary | ICD-10-CM | POA: Insufficient documentation

## 2023-01-30 DIAGNOSIS — F0394 Unspecified dementia, unspecified severity, with anxiety: Secondary | ICD-10-CM | POA: Diagnosis not present

## 2023-01-30 DIAGNOSIS — I10 Essential (primary) hypertension: Secondary | ICD-10-CM | POA: Diagnosis not present

## 2023-01-30 DIAGNOSIS — N2889 Other specified disorders of kidney and ureter: Secondary | ICD-10-CM | POA: Diagnosis present

## 2023-01-30 DIAGNOSIS — Z90710 Acquired absence of both cervix and uterus: Secondary | ICD-10-CM | POA: Diagnosis not present

## 2023-01-30 DIAGNOSIS — Z801 Family history of malignant neoplasm of trachea, bronchus and lung: Secondary | ICD-10-CM | POA: Diagnosis not present

## 2023-01-30 DIAGNOSIS — R918 Other nonspecific abnormal finding of lung field: Secondary | ICD-10-CM | POA: Diagnosis not present

## 2023-01-30 DIAGNOSIS — Z9011 Acquired absence of right breast and nipple: Secondary | ICD-10-CM | POA: Insufficient documentation

## 2023-01-30 NOTE — Progress Notes (Signed)
[facility] CONSULT NOTE  Patient Care Team: Barbette Reichmann, MD as PCP - General (Internal Medicine) Antonieta Iba, MD as PCP - Cardiology (Cardiology)  CANCER STAGING   Cancer Staging  No matching staging information was found for the patient.   ASSESSMENT & PLAN:  Megan Ross 89 y.o. female with pmh of dementia, anxiety, hypertension, remote history of right breast cancer, hyperlipidemia was referred to medical oncology for left renal mass.  # Left renal mass -Incidentally found on MRI lumbar spine done for back pain.  CT and MRI abdomen pelvis from February 2024 showed exophytic 2.6 cm left renal mass confined to the kidney without extension beyond the fascia or into the renal sinus.  No other lymphadenopathy was noted.  - CT chest done for staging did not show any metastatic disease. There are 2 small nodules in the superior segment of the right lower lobe which are not clearly seen on the prior study, although that examination was performed with thicker collimation and there was more dependent atelectasis in the lungs which could have obscured small nodules. Given the patient's history, attention on follow-up CT in 6-12 months suggested.  -On surveillance.  CT renal protocol from August 2024 showed stable left renal mass measuring 2.8 x 2.4 cm which is unchanged.  The mass is unchanged in the past 6 months.  I will repeat CT renal protocol in 6 months.  If renal mass remains stable at that time we can move to annual monitoring.  Next visit I will also discussed with the patient about repeating CT chest.  # Dementia -On Aricept  # Hypertension -on amlodipine, losartan  # Depression-on Zoloft  Orders Placed This Encounter  Procedures   CT RENAL ABD W/WO    Standing Status:   Future    Standing Expiration Date:   01/30/2024    Order Specific Question:   If indicated for the ordered procedure, I authorize the administration of contrast media per Radiology  protocol    Answer:   Yes    Order Specific Question:   Does the patient have a contrast media/X-ray dye allergy?    Answer:   No    Order Specific Question:   Preferred imaging location?    Answer:   [facility]   RTC in 6 months for MD visit, scans.  The total time spent in the appointment was 25 min encounter with patients including review of chart and various tests results, discussions about plan of care and coordination of care plan   All questions were answered. The patient knows to call the clinic with any problems, questions or concerns. No barriers to learning was detected.  Michaelyn Barter, MD 8/30/20244:07 PM   HISTORY OF PRESENTING ILLNESS:  Megan Ross 89 y.o. female with pmh of dementia, anxiety, hypertension, remote history of right breast cancer, hyperlipidemia was referred to medical oncology for left renal mass.  Patient had MRI of the lumbar spine on 06/24/2022 for chronic low back pain.  It incidentally showed a new 2.5 cm exophytic lesion in the left kidney.  This was further evaluated by CT and MR abdomen.  It showed 2.6 cm left interpolar renal mass with no extension beyond the renal fascia and into the renal sinus.  No other lymphadenopathy was noted.  Patient was seen today accompanied by her daughter [name].  Her ECOG status is about 3.  She uses walker at home.  Spends time watching TV and in couch.  Has  history of dementia.    Interval history- Patient was seen today accompanied by daughter to discuss CT imaging.  She is on surveillance.  Patient has been feeling well.  Denies any abdominal pain, changes in bowel movement or urination.  Denies any blood in urine.  I have reviewed her chart and materials related to her cancer extensively and collaborated history with the patient. Summary of oncologic history is as follows: Oncology History   No history exists.    MEDICAL HISTORY:  Past Medical History:  Diagnosis Date   Anxiety    Arthritis     Breast cancer (HCC)    right breast ca   Cancer (HCC)    Dementia (HCC)    Depression    Hypertension     SURGICAL HISTORY: Past Surgical History:  Procedure Laterality Date   ABDOMINAL HYSTERECTOMY     MASTECTOMY Right    breast ca    SOCIAL HISTORY: Social History   Socioeconomic History   Marital status: Widowed    Spouse name: Not on file   Number of children: Not on file   Years of education: Not on file   Highest education level: Not on file  Occupational History   Not on file  Tobacco Use   Smoking status: Never   Smokeless tobacco: Never  Substance and Sexual Activity   Alcohol use: No   Drug use: Never   Sexual activity: Not Currently  Other Topics Concern   Not on file  Social History Narrative   Not on file   Social Determinants of Health   Financial Resource Strain: Low Risk  (11/17/2022)   Received from [facility]   Overall Financial Resource Strain (CARDIA)    Difficulty of Paying Living Expenses: Not hard at all  Food Insecurity: No Food Insecurity (11/17/2022)   Received from [facility]   Hunger Vital Sign    Worried About Running Out of Food in the Last Year: Never true    Ran Out of Food in the Last Year: Never true  Transportation Needs: No Transportation Needs (11/17/2022)   Received from [facility] - Transportation    In the past 12 months, has lack of transportation kept you from medical appointments or from getting medications?: No    Lack of Transportation (Non-Medical): No  Physical Activity: Inactive (08/08/2022)   Exercise Vital Sign    Days of Exercise per Week: 0 days    Minutes of Exercise per Session: 0 min  Stress: No Stress Concern Present (08/08/2022)   [employer] of Occupational Health - Occupational Stress Questionnaire    Feeling of Stress : Only a little  Social Connections: Moderately Isolated (08/08/2022)   Social Connection and Isolation Panel  [NHANES]    Frequency of Communication with Friends and Family: More than three times a week    Frequency of Social Gatherings with Friends and Family: Not on file    Attends Religious Services: More than 4 times per year    Active Member of [employer] or Organizations: No    Attends Banker Meetings: Never    Marital Status: Widowed  Intimate Partner Violence: Not At Risk (08/05/2022)   Humiliation, Afraid, Rape, and Kick questionnaire    Fear of Current or Ex-Partner: No    Emotionally Abused: No    Physically Abused: No    Sexually Abused: No    FAMILY HISTORY: Family History  Problem Relation Age of  Onset   Breast cancer Sister    Lung cancer Brother    Cancer Brother        unknow    ALLERGIES:  is allergic to lisinopril.  MEDICATIONS:  Current Outpatient Medications  Medication Sig Dispense Refill   amLODipine (NORVASC) 2.5 MG tablet Take by mouth.     donepezil (ARICEPT) 5 MG tablet Take 5 mg by mouth daily.     dorzolamide (TRUSOPT) 2 % ophthalmic solution Place 1 drop into both eyes 2 (two) times daily.   4   fluticasone (FLONASE) 50 MCG/ACT nasal spray Place 2 sprays into both nostrils daily.   4   hydrochlorothiazide (MICROZIDE) 12.5 MG capsule Take 12.5 mg by mouth daily.  4   latanoprost (XALATAN) 0.005 % ophthalmic solution Place 1 drop into both eyes at bedtime.  4   losartan (COZAAR) 50 MG tablet Take 50 mg by mouth daily.     montelukast (SINGULAIR) 10 MG tablet Take 10 mg by mouth at bedtime.  4   omeprazole (PRILOSEC) 20 MG capsule Take 20 mg by mouth daily.     potassium chloride (KLOR-CON) 10 MEQ tablet TAKE 1 TABLET BY MOUTH EVERY DAY 90 tablet 0   simvastatin (ZOCOR) 20 MG tablet Take 20 mg by mouth daily.  0   timolol (TIMOPTIC) 0.5 % ophthalmic solution Place 1 drop into both eyes 2 (two) times daily.   4   sertraline (ZOLOFT) 50 MG tablet Take by mouth.     traMADol (ULTRAM) 50 MG tablet Take 50 mg by mouth 2 (two) times daily as needed.  (Patient not taking: Reported on 08/05/2022)     No current facility-administered medications for this visit.    REVIEW OF SYSTEMS:   Pertinent information mentioned in HPI All other systems were reviewed with the patient and are negative.  PHYSICAL EXAMINATION: ECOG PERFORMANCE STATUS: 3 - Symptomatic, >50% confined to bed  Vitals:   01/30/23 1313  BP: (!) 146/70  Pulse: 64  Temp: (!) 96.7 F (35.9 C)  SpO2: 100%    Filed Weights   01/30/23 1313  Weight: 160 lb (72.6 kg)     GENERAL:alert, no distress and comfortable SKIN: skin color, texture, turgor are normal, no rashes or significant lesions EYES: normal, conjunctiva are pink and non-injected, sclera clear OROPHARYNX:no exudate, no erythema and lips, buccal mucosa, and tongue normal  NECK: supple, thyroid normal size, non-tender, without nodularity LYMPH:  no palpable lymphadenopathy in the cervical, axillary or inguinal LUNGS: clear to auscultation and percussion with normal breathing effort HEART: regular rate & rhythm and no murmurs and no lower extremity edema ABDOMEN:abdomen soft, non-tender and normal bowel sounds Musculoskeletal:no cyanosis of digits and no clubbing  PSYCH: alert & oriented x 3 with fluent speech NEURO: no focal motor/sensory deficits  LABORATORY DATA:  I have reviewed the data as listed Lab Results  Component Value Date   WBC 5.3 09/23/2022   HGB 11.9 (L) 09/23/2022   HCT 37.4 09/23/2022   MCV 94.0 09/23/2022   PLT 336 09/23/2022   Recent Labs    07/21/22 1935 09/23/22 1301  NA 139 136  K 3.9 4.3  CL 105 102  CO2 21* 26  GLUCOSE 89 98  BUN 15 23  CREATININE 0.91 1.02*  CALCIUM 9.8 9.7  GFRNONAA >60 54*  PROT  --  7.5  ALBUMIN  --  4.5  AST  --  15  ALT  --  12  ALKPHOS  --  47  BILITOT  --  1.1    RADIOGRAPHIC STUDIES: I have personally reviewed the radiological images as listed and agreed with the findings in the report. CT RENAL ABD W/WO  Result Date:  01/16/2023 CLINICAL DATA:  Left renal mass surveillance * Tracking Code: BO * EXAM: CT ABDOMEN WITHOUT AND WITH CONTRAST TECHNIQUE: Multidetector CT imaging of the abdomen was performed following the standard protocol before and following the bolus administration of intravenous contrast. RADIATION DOSE REDUCTION: This exam was performed according to the departmental dose-optimization program which includes automated exposure control, adjustment of the mA and/or kV according to patient size and/or use of iterative reconstruction technique. CONTRAST:  OMNIPAQUE IOHEXOL 300 MG/ML  SOLN COMPARISON:  09/19/2022 FINDINGS: Lower chest: No acute abnormality. Hepatobiliary: No focal liver abnormality is seen. Status post cholecystectomy. No biliary dilatation. Pancreas: Unremarkable. No pancreatic ductal dilatation or surrounding inflammatory changes. Spleen: Normal in size without significant abnormality. Adrenals/Urinary Tract: Adrenal glands are unremarkable. Unchanged heterogeneously exophytic mass of the posterior midportion left kidney measuring 2.8 x 2.4 cm (series 8, image 61). Additional simple bilateral renal cortical cysts, benign, which no follow-up or characterization is required. Stomach/Bowel: Stomach is within normal limits. No evidence of bowel wall thickening, distention, or inflammatory changes. Colonic diverticulosis. Vascular/Lymphatic: Aortic atherosclerosis. No enlarged abdominal or pelvic lymph nodes. Other: No abdominal wall hernia or abnormality. No ascites. Musculoskeletal: No acute or significant osseous findings. IMPRESSION: 1. Unchanged heterogeneously exophytic mass of the posterior midportion left kidney measuring 2.8 x 2.4 cm, consistent with renal cell carcinoma. 2. No evidence of renal vein invasion, lymphadenopathy or metastatic disease in the abdomen. 3. Colonic diverticulosis. Aortic Atherosclerosis (ICD10-I70.0). Electronically Signed   By: Jearld Lesch M.D.   On: 01/16/2023  20:39

## 2023-01-30 NOTE — Progress Notes (Signed)
Patient says about 2 weeks ago she started having some dizziness.

## 2023-07-13 ENCOUNTER — Other Ambulatory Visit: Payer: Medicare Other

## 2023-07-14 ENCOUNTER — Ambulatory Visit: Admission: RE | Admit: 2023-07-14 | Payer: Medicare Other | Source: Ambulatory Visit

## 2023-07-17 ENCOUNTER — Ambulatory Visit
Admission: RE | Admit: 2023-07-17 | Discharge: 2023-07-17 | Disposition: A | Discharge status: Home / Self Care | Payer: Medicare Other | Source: Ambulatory Visit | Attending: Internal Medicine | Admitting: Internal Medicine

## 2023-07-17 DIAGNOSIS — N2889 Other specified disorders of kidney and ureter: Secondary | ICD-10-CM | POA: Insufficient documentation

## 2023-07-17 MED ORDER — IOHEXOL 300 MG/ML  SOLN
100.0000 mL | Freq: Once | INTRAMUSCULAR | Status: AC | PRN
Start: 2023-07-17 — End: 2023-07-17
  Administered 2023-07-17: 100 mL via INTRAVENOUS

## 2023-07-31 ENCOUNTER — Inpatient Hospital Stay: Payer: Medicare Other | Admitting: Internal Medicine

## 2023-08-11 ENCOUNTER — Encounter: Payer: Self-pay | Admitting: Internal Medicine

## 2023-08-11 ENCOUNTER — Inpatient Hospital Stay: Payer: Medicare Other | Attending: Internal Medicine | Admitting: Internal Medicine

## 2023-08-11 VITALS — BP 120/78 | HR 68 | Temp 97.9°F | Resp 14 | Wt 151.0 lb

## 2023-08-11 DIAGNOSIS — Z801 Family history of malignant neoplasm of trachea, bronchus and lung: Secondary | ICD-10-CM | POA: Diagnosis not present

## 2023-08-11 DIAGNOSIS — F0393 Unspecified dementia, unspecified severity, with mood disturbance: Secondary | ICD-10-CM | POA: Diagnosis not present

## 2023-08-11 DIAGNOSIS — Z803 Family history of malignant neoplasm of breast: Secondary | ICD-10-CM | POA: Diagnosis not present

## 2023-08-11 DIAGNOSIS — Z853 Personal history of malignant neoplasm of breast: Secondary | ICD-10-CM | POA: Diagnosis not present

## 2023-08-11 DIAGNOSIS — I10 Essential (primary) hypertension: Secondary | ICD-10-CM | POA: Insufficient documentation

## 2023-08-11 DIAGNOSIS — R911 Solitary pulmonary nodule: Secondary | ICD-10-CM | POA: Diagnosis not present

## 2023-08-11 DIAGNOSIS — D649 Anemia, unspecified: Secondary | ICD-10-CM | POA: Diagnosis not present

## 2023-08-11 DIAGNOSIS — F32A Depression, unspecified: Secondary | ICD-10-CM | POA: Diagnosis not present

## 2023-08-11 DIAGNOSIS — Z90710 Acquired absence of both cervix and uterus: Secondary | ICD-10-CM | POA: Diagnosis not present

## 2023-08-11 DIAGNOSIS — N2889 Other specified disorders of kidney and ureter: Secondary | ICD-10-CM | POA: Diagnosis present

## 2023-08-11 DIAGNOSIS — Z79899 Other long term (current) drug therapy: Secondary | ICD-10-CM | POA: Diagnosis not present

## 2023-08-11 NOTE — Progress Notes (Signed)
 Patient says that she has been doing well, she had a CT scan on 07/17/2023, so she is anxious to see what the results are.

## 2023-08-11 NOTE — Progress Notes (Signed)
 [facility] CONSULT NOTE  Patient Care Team: Barbette Reichmann, MD as PCP - General (Internal Medicine) Antonieta Iba, MD as PCP - Cardiology (Cardiology)  CANCER STAGING   Cancer Staging  No matching staging information was found for the patient.   ASSESSMENT & PLAN:  Megan Ross 89 y.o. female with pmh of dementia, anxiety, hypertension, remote history of right breast cancer, hyperlipidemia was referred to medical oncology for left renal mass.  # Left renal mass -Incidentally found on MRI lumbar spine done for back pain.  CT and MRI abdomen pelvis from February 2024 showed exophytic 2.6 cm left renal mass confined to the kidney without extension beyond the fascia or into the renal sinus.  No other lymphadenopathy was noted.  -CT renal reviewed from 07/17/2023.  Left renal mass largely unchanged measuring 2.7 x 2.4 cm.  No other evidence of metastatic disease.  There has been no change in the size of the mass in the past 1 year.  Repeat CT renal annually.  - CT chest done in March 2024 for staging did not show any metastatic disease. There are 2 small nodules in the superior segment of the right lower lobe which are not clearly seen on the prior study, although that examination was performed with thicker collimation and there was more dependent atelectasis in the lungs which could have obscured small nodules. Given the patient's history, attention on follow-up CT in 6-12 months suggested.  Will schedule for CT chest as recommended by radiology to ensure stability.  If nodules are stable, may not need further imaging.  I will call the daughter once the CT chest results are available.  # Dementia -On Aricept  # Hypertension -on amlodipine, losartan  # Depression-on Zoloft  # Mild anemia-hemoglobin ranging between 10-11.  Ferritin, B12 folate was normal. -Defer to PCP.  If hemoglobin worsens, will do further testing.  Orders Placed This Encounter  Procedures   CT  Chest W Contrast    Standing Status:   Future    Expected Date:   08/25/2023    Expiration Date:   08/10/2024    If indicated for the ordered procedure, I authorize the administration of contrast media per Radiology protocol:   Yes    Does the patient have a contrast media/X-ray dye allergy?:   No    Preferred imaging location?:   [facility]   CT RENAL ABD W/WO    Standing Status:   Future    Expected Date:   08/10/2024    Expiration Date:   02/10/2025    If indicated for the ordered procedure, I authorize the administration of contrast media per Radiology protocol:   Yes    Does the patient have a contrast media/X-ray dye allergy?:   No    Preferred imaging location?:   [facility]   RTC in 1 year for MD visit, to discuss scans.  The total time spent in the appointment was 25 min encounter with patients including review of chart and various tests results, discussions about plan of care and coordination of care plan   All questions were answered. The patient knows to call the clinic with any problems, questions or concerns. No barriers to learning was detected.  Michaelyn Barter, MD 3/11/20252:56 PM   HISTORY OF PRESENTING ILLNESS:  Megan Ross 89 y.o. female with pmh of dementia, anxiety, hypertension, remote history of right breast cancer, hyperlipidemia was referred to medical oncology for left renal mass.  Patient had  MRI of the lumbar spine on 06/24/2022 for chronic low back pain.  It incidentally showed a new 2.5 cm exophytic lesion in the left kidney.  This was further evaluated by CT and MR abdomen.  It showed 2.6 cm left interpolar renal mass with no extension beyond the renal fascia and into the renal sinus.  No other lymphadenopathy was noted.  Patient was seen today accompanied by her daughter [name].  Her ECOG status is about 3.  She uses walker at home.  Spends time watching TV and in couch.  Has history of dementia.    Interval history- Patient was seen  today accompanied by daughter to discuss CT imaging.  She is on surveillance.  Patient has been feeling well.  Denies any abdominal pain, changes in bowel movement or urination.  Denies any blood in urine.  I have reviewed her chart and materials related to her cancer extensively and collaborated history with the patient. Summary of oncologic history is as follows: Oncology History   No history exists.    MEDICAL HISTORY:  Past Medical History:  Diagnosis Date   Anxiety    Arthritis    Breast cancer (HCC)    right breast ca   Cancer (HCC)    Dementia (HCC)    Depression    Hypertension     SURGICAL HISTORY: Past Surgical History:  Procedure Laterality Date   ABDOMINAL HYSTERECTOMY     MASTECTOMY Right    breast ca    SOCIAL HISTORY: Social History   Socioeconomic History   Marital status: Widowed    Spouse name: Not on file   Number of children: Not on file   Years of education: Not on file   Highest education level: Not on file  Occupational History   Not on file  Tobacco Use   Smoking status: Never   Smokeless tobacco: Never  Substance and Sexual Activity   Alcohol use: No   Drug use: Never   Sexual activity: Not Currently  Other Topics Concern   Not on file  Social History Narrative   Not on file   Social Drivers of Health   Financial Resource Strain: Low Risk  (04/09/2023)   Received from [facility]   Overall Financial Resource Strain (CARDIA)    Difficulty of Paying Living Expenses: Not hard at all  Food Insecurity: No Food Insecurity (04/09/2023)   Received from [facility]   Hunger Vital Sign    Worried About Running Out of Food in the Last Year: Never true    Ran Out of Food in the Last Year: Never true  Transportation Needs: No Transportation Needs (04/09/2023)   Received from [facility]   PRAPARE - Transportation    Lack of Transportation (Non-Medical): No    In the past 12 months, has  lack of transportation kept you from medical appointments or from getting medications?: No  Physical Activity: Inactive (08/08/2022)   Exercise Vital Sign    Days of Exercise per Week: 0 days    Minutes of Exercise per Session: 0 min  Stress: No Stress Concern Present (08/08/2022)   [employer] of Occupational Health - Occupational Stress Questionnaire    Feeling of Stress : Only a little  Social Connections: Moderately Isolated (08/08/2022)   Social Connection and Isolation Panel [NHANES]    Frequency of Communication with Friends and Family: More than three times a week    Frequency of Social Gatherings with Friends and  Family: Not on file    Attends Religious Services: More than 4 times per year    Active Member of Clubs or Organizations: No    Attends Banker Meetings: Never    Marital Status: Widowed  Intimate Partner Violence: Not At Risk (08/05/2022)   Humiliation, Afraid, Rape, and Kick questionnaire    Fear of Current or Ex-Partner: No    Emotionally Abused: No    Physically Abused: No    Sexually Abused: No    FAMILY HISTORY: Family History  Problem Relation Age of Onset   Breast cancer Sister    Lung cancer Brother    Cancer Brother        unknow    ALLERGIES:  is allergic to lisinopril.  MEDICATIONS:  Current Outpatient Medications  Medication Sig Dispense Refill   amLODipine (NORVASC) 2.5 MG tablet Take by mouth.     donepezil (ARICEPT) 5 MG tablet Take 5 mg by mouth daily.     dorzolamide (TRUSOPT) 2 % ophthalmic solution Place 1 drop into both eyes 2 (two) times daily.   4   fluticasone (FLONASE) 50 MCG/ACT nasal spray Place 2 sprays into both nostrils daily.   4   hydrochlorothiazide (MICROZIDE) 12.5 MG capsule Take 12.5 mg by mouth daily.  4   latanoprost (XALATAN) 0.005 % ophthalmic solution Place 1 drop into both eyes at bedtime.  4   losartan (COZAAR) 50 MG tablet Take 50 mg by mouth daily.     montelukast (SINGULAIR) 10 MG tablet Take 10 mg  by mouth at bedtime.  4   omeprazole (PRILOSEC) 20 MG capsule Take 20 mg by mouth daily.     potassium chloride (KLOR-CON) 10 MEQ tablet TAKE 1 TABLET BY MOUTH EVERY DAY 90 tablet 0   sertraline (ZOLOFT) 50 MG tablet Take by mouth.     simvastatin (ZOCOR) 20 MG tablet Take 20 mg by mouth daily.  0   timolol (TIMOPTIC) 0.5 % ophthalmic solution Place 1 drop into both eyes 2 (two) times daily.   4   traMADol (ULTRAM) 50 MG tablet Take 50 mg by mouth 2 (two) times daily as needed. (Patient not taking: Reported on 08/11/2023)     No current facility-administered medications for this visit.    REVIEW OF SYSTEMS:   Pertinent information mentioned in HPI All other systems were reviewed with the patient and are negative.  PHYSICAL EXAMINATION: ECOG PERFORMANCE STATUS: 3 - Symptomatic, >50% confined to bed  Vitals:   08/11/23 1338  BP: 120/78  Pulse: 68  Resp: 14  Temp: 97.9 F (36.6 C)  SpO2: 99%    Filed Weights   08/11/23 1338  Weight: 151 lb (68.5 kg)     GENERAL:alert, no distress and comfortable SKIN: skin color, texture, turgor are normal, no rashes or significant lesions EYES: normal, conjunctiva are pink and non-injected, sclera clear OROPHARYNX:no exudate, no erythema and lips, buccal mucosa, and tongue normal  NECK: supple, thyroid normal size, non-tender, without nodularity LYMPH:  no palpable lymphadenopathy in the cervical, axillary or inguinal LUNGS: clear to auscultation and percussion with normal breathing effort HEART: regular rate & rhythm and no murmurs and no lower extremity edema ABDOMEN:abdomen soft, non-tender and normal bowel sounds Musculoskeletal:no cyanosis of digits and no clubbing  PSYCH: alert & oriented x 3 with fluent speech NEURO: no focal motor/sensory deficits  LABORATORY DATA:  I have reviewed the data as listed Lab Results  Component Value Date   WBC 5.3 09/23/2022  HGB 11.9 (L) 09/23/2022   HCT 37.4 09/23/2022   MCV 94.0 09/23/2022    PLT 336 09/23/2022   Recent Labs    09/23/22 1301  NA 136  K 4.3  CL 102  CO2 26  GLUCOSE 98  BUN 23  CREATININE 1.02*  CALCIUM 9.7  GFRNONAA 54*  PROT 7.5  ALBUMIN 4.5  AST 15  ALT 12  ALKPHOS 47  BILITOT 1.1    RADIOGRAPHIC STUDIES: I have personally reviewed the radiological images as listed and agreed with the findings in the report. CT RENAL ABD W/WO Result Date: 07/30/2023 CLINICAL DATA:  Follow-up left renal mass.  * Tracking Code: BO * EXAM: CT ABDOMEN WITHOUT AND WITH CONTRAST TECHNIQUE: Multidetector CT imaging of the abdomen was performed following the standard protocol before and following the bolus administration of intravenous contrast. RADIATION DOSE REDUCTION: This exam was performed according to the departmental dose-optimization program which includes automated exposure control, adjustment of the mA and/or kV according to patient size and/or use of iterative reconstruction technique. CONTRAST:  OMNIPAQUE IOHEXOL 300 MG/ML  SOLN COMPARISON:  Multiple prior imaging studies. The most recent CT scan is 01/12/2023 FINDINGS: Lower chest: The lung bases are clear of acute process. No pleural effusion or pulmonary lesions. The heart is normal in size. No pericardial effusion. The distal esophagus and aorta are unremarkable. Hepatobiliary: No hepatic lesions or intrahepatic biliary dilatation. The gallbladder is surgically absent. No common bile duct dilatation. The portal and hepatic veins are patent. Pancreas: Unremarkable. No pancreatic ductal dilatation or surrounding inflammatory changes. Spleen: Normal in size without focal abnormality. Adrenals/Urinary Tract: The adrenal glands are normal. 2.7 x 2.4 cm exophytic solid enhancing left renal mass is largely unchanged. 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 Stable simple bilateral nonenhancing renal cysts not requiring any further imaging evaluation or follow-up. The delayed images do not demonstrate any  significant collecting system abnormalities. Stomach/Bowel: The stomach is moderately distended with fluid and air. Scattered pills are noted in the stomach. The duodenum, visualized small bowel and visualized colon are unremarkable. Stable colonic diverticulosis. Vascular/Lymphatic: Stable advanced atherosclerotic calcifications involving the aorta and branch vessels but no aneurysm or dissection. The major venous structures are patent. No mesenteric or retroperitoneal adenopathy. Other: No ascites or abdominal wall hernia or subcutaneous lesions. Musculoskeletal: Stable degenerative changes involving the spine. No bone lesions or fractures. IMPRESSION: 1. 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 2. No findings for abdominal metastatic disease. 3. Status post cholecystectomy. No biliary dilatation. 4. Stable advanced atherosclerotic calcifications involving the aorta and branch vessels. 5. Aortic atherosclerosis. Electronically Signed   By: Rudie Meyer M.D.   On: 07/30/2023 16:14

## 2023-09-09 ENCOUNTER — Ambulatory Visit: Admission: RE | Admit: 2023-09-09 | Source: Ambulatory Visit

## 2023-09-30 ENCOUNTER — Ambulatory Visit
Admission: RE | Admit: 2023-09-30 | Discharge: 2023-09-30 | Disposition: A | Discharge status: Home / Self Care | Source: Ambulatory Visit | Attending: Internal Medicine | Admitting: Internal Medicine

## 2023-09-30 DIAGNOSIS — N2889 Other specified disorders of kidney and ureter: Secondary | ICD-10-CM | POA: Diagnosis present

## 2023-09-30 DIAGNOSIS — R911 Solitary pulmonary nodule: Secondary | ICD-10-CM | POA: Diagnosis present

## 2023-09-30 LAB — POCT I-STAT CREATININE: Creatinine, Ser: 1.1 mg/dL — ABNORMAL HIGH (ref 0.44–1.00)

## 2023-09-30 MED ORDER — IOHEXOL 300 MG/ML  SOLN
75.0000 mL | Freq: Once | INTRAMUSCULAR | Status: AC | PRN
  Administered 2023-09-30: 75 mL via INTRAVENOUS

## 2024-07-13 ENCOUNTER — Ambulatory Visit

## 2024-08-10 ENCOUNTER — Ambulatory Visit: Admitting: Oncology
